# Patient Record
Sex: Female | Born: 1960 | Race: White | Hispanic: No | Smoking: Current every day smoker
Health system: Southern US, Community
[De-identification: ages and names within clinical notes are randomized; demographics above are authoritative.]

## PROBLEM LIST (undated history)

## (undated) DIAGNOSIS — F329 Major depressive disorder, single episode, unspecified: Secondary | ICD-10-CM

## (undated) DIAGNOSIS — K602 Anal fissure, unspecified: Secondary | ICD-10-CM

## (undated) DIAGNOSIS — F319 Bipolar disorder, unspecified: Secondary | ICD-10-CM

## (undated) DIAGNOSIS — F419 Anxiety disorder, unspecified: Secondary | ICD-10-CM

## (undated) DIAGNOSIS — Z87898 Personal history of other specified conditions: Secondary | ICD-10-CM

## (undated) DIAGNOSIS — M797 Fibromyalgia: Secondary | ICD-10-CM

## (undated) DIAGNOSIS — R569 Unspecified convulsions: Secondary | ICD-10-CM

## (undated) DIAGNOSIS — I619 Nontraumatic intracerebral hemorrhage, unspecified: Secondary | ICD-10-CM

## (undated) DIAGNOSIS — F32A Depression, unspecified: Secondary | ICD-10-CM

## (undated) DIAGNOSIS — I8290 Acute embolism and thrombosis of unspecified vein: Secondary | ICD-10-CM

## (undated) DIAGNOSIS — D689 Coagulation defect, unspecified: Secondary | ICD-10-CM

## (undated) DIAGNOSIS — Q600 Renal agenesis, unilateral: Secondary | ICD-10-CM

## (undated) HISTORY — DX: Unspecified convulsions: R56.9

## (undated) HISTORY — PX: TUBAL LIGATION: SHX77

## (undated) HISTORY — PX: TONSILLECTOMY: SUR1361

## (undated) HISTORY — DX: Renal agenesis, unilateral: Q60.0

## (undated) HISTORY — DX: Anxiety disorder, unspecified: F41.9

## (undated) HISTORY — DX: Coagulation defect, unspecified: D68.9

## (undated) HISTORY — DX: Personal history of other specified conditions: Z87.898

## (undated) HISTORY — DX: Anal fissure, unspecified: K60.2

## (undated) HISTORY — DX: Acute embolism and thrombosis of unspecified vein: I82.90

## (undated) HISTORY — DX: Fibromyalgia: M79.7

## (undated) HISTORY — DX: Nontraumatic intracerebral hemorrhage, unspecified: I61.9

## (undated) HISTORY — PX: APPENDECTOMY: SHX54

## (undated) HISTORY — PX: BREAST SURGERY: SHX581

## (undated) HISTORY — DX: Bipolar disorder, unspecified: F31.9

## (undated) HISTORY — PX: LAPAROSCOPY: SHX197

---

## 1998-02-07 HISTORY — PX: LEEP: SHX91

## 2005-04-05 ENCOUNTER — Emergency Department (HOSPITAL_COMMUNITY): Admission: EM | Admit: 2005-04-05 | Discharge: 2005-04-06 | Payer: Self-pay | Admitting: Emergency Medicine

## 2005-04-06 ENCOUNTER — Ambulatory Visit: Payer: Self-pay | Admitting: Psychiatry

## 2005-04-06 ENCOUNTER — Inpatient Hospital Stay (HOSPITAL_COMMUNITY): Admission: AD | Admit: 2005-04-06 | Discharge: 2005-04-09 | Payer: Self-pay | Admitting: Psychiatry

## 2005-05-31 ENCOUNTER — Ambulatory Visit: Payer: Self-pay | Admitting: Family Medicine

## 2005-07-12 ENCOUNTER — Ambulatory Visit: Payer: Self-pay | Admitting: Family Medicine

## 2005-07-12 ENCOUNTER — Other Ambulatory Visit: Admission: RE | Admit: 2005-07-12 | Discharge: 2005-07-12 | Payer: Self-pay | Admitting: Gynecology

## 2005-07-25 ENCOUNTER — Encounter: Admission: RE | Admit: 2005-07-25 | Discharge: 2005-07-25 | Payer: Self-pay | Admitting: Gynecology

## 2006-01-24 ENCOUNTER — Ambulatory Visit: Payer: Self-pay | Admitting: Family Medicine

## 2006-05-09 DIAGNOSIS — I8290 Acute embolism and thrombosis of unspecified vein: Secondary | ICD-10-CM

## 2006-05-09 HISTORY — DX: Acute embolism and thrombosis of unspecified vein: I82.90

## 2006-05-15 ENCOUNTER — Ambulatory Visit: Payer: Self-pay | Admitting: Family Medicine

## 2006-05-19 ENCOUNTER — Ambulatory Visit: Payer: Self-pay | Admitting: Family Medicine

## 2006-05-21 ENCOUNTER — Emergency Department (HOSPITAL_COMMUNITY): Admission: RE | Admit: 2006-05-21 | Discharge: 2006-05-21 | Payer: Self-pay | Admitting: Emergency Medicine

## 2006-05-24 ENCOUNTER — Emergency Department (HOSPITAL_COMMUNITY): Admission: EM | Admit: 2006-05-24 | Discharge: 2006-05-24 | Payer: Self-pay | Admitting: Emergency Medicine

## 2006-05-30 ENCOUNTER — Ambulatory Visit: Payer: Self-pay | Admitting: Family Medicine

## 2006-06-02 ENCOUNTER — Ambulatory Visit (HOSPITAL_COMMUNITY): Admission: RE | Admit: 2006-06-02 | Discharge: 2006-06-02 | Payer: Self-pay | Admitting: Neurology

## 2006-06-02 ENCOUNTER — Inpatient Hospital Stay (HOSPITAL_COMMUNITY): Admission: AD | Admit: 2006-06-02 | Discharge: 2006-06-09 | Payer: Self-pay | Admitting: Neurology

## 2006-06-12 ENCOUNTER — Ambulatory Visit: Payer: Self-pay | Admitting: Family Medicine

## 2006-06-19 ENCOUNTER — Ambulatory Visit: Payer: Self-pay | Admitting: Family Medicine

## 2006-06-29 ENCOUNTER — Ambulatory Visit: Payer: Self-pay | Admitting: Family Medicine

## 2006-07-12 ENCOUNTER — Ambulatory Visit: Payer: Self-pay | Admitting: Family Medicine

## 2006-08-14 ENCOUNTER — Ambulatory Visit: Payer: Self-pay | Admitting: Family Medicine

## 2006-08-28 ENCOUNTER — Ambulatory Visit: Payer: Self-pay | Admitting: Family Medicine

## 2006-08-28 DIAGNOSIS — I824Y9 Acute embolism and thrombosis of unspecified deep veins of unspecified proximal lower extremity: Secondary | ICD-10-CM

## 2006-08-28 LAB — CONVERTED CEMR LAB

## 2006-08-30 ENCOUNTER — Encounter: Payer: Self-pay | Admitting: Family Medicine

## 2006-09-07 ENCOUNTER — Telehealth: Payer: Self-pay | Admitting: Family Medicine

## 2006-09-13 ENCOUNTER — Ambulatory Visit: Payer: Self-pay | Admitting: Family Medicine

## 2006-09-29 ENCOUNTER — Ambulatory Visit: Payer: Self-pay | Admitting: Family Medicine

## 2006-09-29 LAB — CONVERTED CEMR LAB
INR: 3.3
Prothrombin Time: 21.8 s

## 2006-10-29 ENCOUNTER — Encounter: Admission: RE | Admit: 2006-10-29 | Discharge: 2006-10-29 | Payer: Self-pay | Admitting: Neurology

## 2006-10-31 ENCOUNTER — Ambulatory Visit: Payer: Self-pay | Admitting: Family Medicine

## 2006-10-31 LAB — CONVERTED CEMR LAB: INR: 1.8

## 2006-11-14 ENCOUNTER — Ambulatory Visit: Payer: Self-pay | Admitting: Family Medicine

## 2006-11-14 DIAGNOSIS — Z8672 Personal history of thrombophlebitis: Secondary | ICD-10-CM | POA: Insufficient documentation

## 2006-11-14 LAB — CONVERTED CEMR LAB: Prothrombin Time: 17.5 s

## 2006-11-28 ENCOUNTER — Ambulatory Visit: Payer: Self-pay | Admitting: Family Medicine

## 2006-11-28 LAB — CONVERTED CEMR LAB: INR: 3.2

## 2006-12-26 ENCOUNTER — Ambulatory Visit: Payer: Self-pay | Admitting: Family Medicine

## 2006-12-26 LAB — CONVERTED CEMR LAB: INR: 2.3

## 2007-01-08 ENCOUNTER — Telehealth: Payer: Self-pay | Admitting: Family Medicine

## 2007-01-25 ENCOUNTER — Ambulatory Visit: Payer: Self-pay | Admitting: Family Medicine

## 2007-02-05 ENCOUNTER — Ambulatory Visit: Payer: Self-pay | Admitting: Family Medicine

## 2007-02-05 LAB — CONVERTED CEMR LAB: INR: 1

## 2007-03-06 ENCOUNTER — Ambulatory Visit: Payer: Self-pay | Admitting: Family Medicine

## 2007-03-06 LAB — CONVERTED CEMR LAB: INR: 1.8

## 2007-06-29 ENCOUNTER — Other Ambulatory Visit: Admission: RE | Admit: 2007-06-29 | Discharge: 2007-06-29 | Payer: Self-pay | Admitting: Gynecology

## 2007-07-17 ENCOUNTER — Telehealth: Payer: Self-pay | Admitting: Family Medicine

## 2008-05-23 ENCOUNTER — Ambulatory Visit: Payer: Self-pay | Admitting: Family Medicine

## 2008-05-23 DIAGNOSIS — R1013 Epigastric pain: Secondary | ICD-10-CM | POA: Insufficient documentation

## 2008-05-23 DIAGNOSIS — F411 Generalized anxiety disorder: Secondary | ICD-10-CM | POA: Insufficient documentation

## 2008-08-25 ENCOUNTER — Ambulatory Visit: Payer: Self-pay | Admitting: Family Medicine

## 2008-08-25 DIAGNOSIS — S0100XA Unspecified open wound of scalp, initial encounter: Secondary | ICD-10-CM | POA: Insufficient documentation

## 2008-09-04 ENCOUNTER — Ambulatory Visit: Payer: Self-pay | Admitting: Family Medicine

## 2009-12-08 ENCOUNTER — Telehealth: Payer: Self-pay | Admitting: Family Medicine

## 2010-01-13 ENCOUNTER — Ambulatory Visit: Payer: Self-pay | Admitting: Family Medicine

## 2010-01-18 LAB — CONVERTED CEMR LAB
Bilirubin Urine: NEGATIVE
Nitrite: NEGATIVE

## 2010-01-20 ENCOUNTER — Ambulatory Visit: Payer: Self-pay | Admitting: Family Medicine

## 2010-01-20 ENCOUNTER — Other Ambulatory Visit
Admission: RE | Admit: 2010-01-20 | Discharge: 2010-01-20 | Payer: Self-pay | Source: Home / Self Care | Admitting: Family Medicine

## 2010-01-20 ENCOUNTER — Encounter: Payer: Self-pay | Admitting: Family Medicine

## 2010-01-20 LAB — CONVERTED CEMR LAB
Alkaline Phosphatase: 70 units/L (ref 39–117)
BUN: 5 mg/dL — ABNORMAL LOW (ref 6–23)
Basophils Absolute: 0 10*3/uL (ref 0.0–0.1)
Bilirubin, Direct: 0.1 mg/dL (ref 0.0–0.3)
CO2: 25 meq/L (ref 19–32)
Calcium: 9.4 mg/dL (ref 8.4–10.5)
Eosinophils Absolute: 0 10*3/uL (ref 0.0–0.7)
GFR calc non Af Amer: 73.75 mL/min (ref >60.00)
Glucose, Bld: 104 mg/dL — ABNORMAL HIGH (ref 70–99)
HCT: 40 % (ref 36.0–46.0)
HDL: 63.8 mg/dL (ref >39.00)
Lymphocytes Relative: 11.6 % — ABNORMAL LOW (ref 12.0–46.0)
Lymphs Abs: 0.9 10*3/uL (ref 0.7–4.0)
MCHC: 34.7 g/dL (ref 30.0–36.0)
MCV: 104.2 fL — ABNORMAL HIGH (ref 78.0–100.0)
Monocytes Absolute: 0.7 10*3/uL (ref 0.1–1.0)
Neutro Abs: 5.8 10*3/uL (ref 1.4–7.7)
RBC: 3.83 M/uL — ABNORMAL LOW (ref 3.87–5.11)
Sodium: 137 meq/L (ref 135–145)
Total Protein: 6.5 g/dL (ref 6.0–8.3)
Vitamin B-12: >1500 pg/mL — ABNORMAL HIGH (ref 211–911)

## 2010-01-22 ENCOUNTER — Telehealth: Payer: Self-pay | Admitting: Family Medicine

## 2010-01-25 ENCOUNTER — Encounter: Payer: Self-pay | Admitting: Family Medicine

## 2010-02-28 ENCOUNTER — Encounter: Payer: Self-pay | Admitting: Family Medicine

## 2010-03-09 NOTE — Progress Notes (Signed)
Summary: req vit b drawn  Phone Note Call from Patient Call back at [phone]   Summary of Call: pt is sch for cpx labs in dec 2011, pt would like to add vit b level to order. Initial call taken by: Heron Sabins,  December 08, 2009 2:57 PM  Follow-up for Phone Call        I agree. Have this added to her labs please  Follow-up by: Nelwyn Salisbury MD,  December 09, 2009 1:11 PM  Additional Follow-up for Phone Call Additional follow up Details #1::        done  Additional Follow-up by: Pura Spice, RN,  December 09, 2009 4:50 PM     Appended Document: req vit b drawn wants b12 lab  ordered . done

## 2010-03-10 ENCOUNTER — Encounter: Payer: Self-pay | Admitting: Family Medicine

## 2010-03-11 NOTE — Letter (Signed)
Summary: Results Follow-up Letter  [place] at [facility]  [address] [place], [place] [address]   Phone: [phone]  Fax: [phone]    01/25/2010  [address] [place], [place]  [address]  Dear Ms. Hoffmeister,   The following are the results of your recent test(s):  Test     Result     Pap Smear    Normal_______   Repeat 1 yr   ____________________  Sincerely, Dr Gershon Crane, Md  [place] at [facility]

## 2010-03-11 NOTE — Progress Notes (Signed)
Summary: change med  Phone Note Refill Request Call back at [phone] Message from:  Pharmacy on January 22, 2010 5:17 PM  Refills Requested: Medication #1:  TRIVEEN-ONE 27-1-250 MG CAPS once daily. [facility] said [facility]   Initial call taken by: Alfred Levins, CMA,  January 22, 2010 5:18 PM  Follow-up for Phone Call        switch to Cerefolin NAC to take once a day. call in a one year supply  Follow-up by: Nelwyn Salisbury MD,  January 22, 2010 5:41 PM    New/Updated Medications: CEREFOLIN NAC 6-2-600 MG TABS (METHYLFOL-METHYLCOB-ACETYLCYST) 1 by mouth once daily Prescriptions: CEREFOLIN NAC 6-2-600 MG TABS (METHYLFOL-METHYLCOB-ACETYLCYST) 1 by mouth once daily  #30 x 11   Entered by:   Alfred Levins, CMA   Authorized by:   Nelwyn Salisbury MD   Signed by:   Alfred Levins, CMA on 01/22/2010   Method used:   Telephoned to ...       [facility] [address] [phone]* (retail)       [address]       [place], [place]  [address]       Ph: [phone] or [phone]       Fax: [phone]   RxID:   [id number]

## 2010-03-11 NOTE — Assessment & Plan Note (Signed)
Summary: cpx/pap/njr   Vital Signs:  Patient profile:   50 year old female Height:      61 inches (154.94 cm) Weight:      108.50 pounds (49.32 kg) BMI:     20.57 O2 Sat:      98 % on Room air Temp:     98.2 degrees F (36.78 degrees C) oral Pulse rate:   111 / minute Resp:     20 per minute BP sitting:   124 / 88  (left arm) Cuff size:   regular  Vitals Entered By: Brenton Grills CMA Duncan Dull) (January 20, 2010 1:47 PM)  O2 Flow:  Room air CC: CPX and Pap/aj Is Patient Diabetic? No Comments Pt is no longer taking Cerefolin or Zegerid   History of Present Illness: 50 yr old female for a cpx. She feels well in general. her last Pap was around 5 years ago. Her last menses was one year ago.   Current Medications (verified): 1)  Seroquel 300 Mg  Tabs (Quetiapine Fumarate) .... Once Daily 2)  Cerefolin Nac 5.6-2-600 Mg  Tabs (Methylfol-Methylcob-Acetylcyst) .... Once Daily 3)  Zegerid 40-1100 Mg Caps (Omeprazole-Sodium Bicarbonate) .... Two Times A Day 4)  Cipro 500 Mg Tabs (Ciprofloxacin Hcl) .... One By Mouth Two Times A Day X 7 Days 5)  Gabapentin 300 Mg Caps (Gabapentin) .Marland Kitchen.. 1 Capsule By Mouth Three Times A Day 6)  Triveen-Duo Dha 29-1-200 & 400 Mg Misc (Prenat-Febis-Fepro-Fa-Ca-Omega) .Marland Kitchen.. 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 Pearlean Brownie. Took Coumadin for 6 months, off now  elevated homocysteine level  bipolar disorder, sees  Dr. Betti Cruz Anxiety fibromyalgia endometriosis single congenital kidney, saw Dr. Vonita Moss  Past Surgical History: Caesarean section Tubal ligation laparoscopy for endometriosis benign breast lump removal LEEP around 2000 for an abnormal Pap smear  Family History: Reviewed history from 05/23/2008 and no changes required. Family History of CAD Female 1st degree relative <50 Family History Hypertension Family History of  Stroke M 1st degree relative <50  Social History: Reviewed history from 05/23/2008 and no changes required. Divorced Current Smoker Alcohol use-no  Review of Systems  The patient denies anorexia, fever, weight loss, weight gain, vision loss, decreased hearing, hoarseness, chest pain, syncope, dyspnea on exertion, peripheral edema, prolonged cough, headaches, hemoptysis, abdominal pain, melena, hematochezia, severe indigestion/heartburn, hematuria, incontinence, genital sores, muscle weakness, suspicious skin lesions, transient blindness, difficulty walking, depression, unusual weight change, abnormal bleeding, enlarged lymph nodes, angioedema, breast masses, and testicular masses.    Physical Exam  General:  Well-developed,well-nourished,in no acute distress; alert,appropriate and cooperative throughout examination Head:  Normocephalic and atraumatic without obvious abnormalities. No apparent alopecia or balding. Eyes:  No corneal or conjunctival inflammation noted. EOMI. Perrla. Funduscopic exam benign, without hemorrhages, exudates or papilledema. Vision grossly normal. Ears:  External ear exam shows no significant lesions or deformities.  Otoscopic examination reveals clear canals, tympanic membranes are intact bilaterally without bulging, retraction, inflammation or discharge. Hearing is grossly normal bilaterally. Nose:  External nasal examination shows no deformity or inflammation. Nasal mucosa are pink and moist without lesions or exudates. Mouth:  Oral mucosa and oropharynx without lesions or exudates.  Teeth in good repair. Neck:  No deformities, masses, or tenderness noted. Chest Wall:  No deformities, masses, or tenderness noted. Lungs:  Normal respiratory effort, chest expands symmetrically. Lungs are clear to auscultation, no crackles or wheezes. Heart:  Normal rate and regular rhythm. S1 and S2 normal without gallop, murmur, click, rub or other extra sounds. Abdomen:  Bowel  sounds positive,abdomen soft and non-tender without masses, organomegaly or hernias noted. Genitalia:  Pelvic Exam:        External: normal female genitalia without lesions or masses        Vagina: normal without lesions or masses        Cervix: normal without lesions or masses        Adnexa: normal bimanual exam without masses or fullness        Uterus: normal by palpation        Pap smear: performed Msk:  No deformity or scoliosis noted of thoracic or lumbar spine.   Pulses:  R and L carotid,radial,femoral,dorsalis pedis and posterior tibial pulses are full and equal bilaterally Extremities:  No clubbing, cyanosis, edema, or deformity noted with normal full range of motion of all joints.   Neurologic:  No cranial nerve deficits noted. Station and gait are normal. Plantar reflexes are down-going bilaterally. DTRs are symmetrical throughout. Sensory, motor and coordinative functions appear intact. Skin:  Intact without suspicious lesions or rashes Cervical Nodes:  No lymphadenopathy noted Axillary Nodes:  No palpable lymphadenopathy Inguinal Nodes:  No significant adenopathy Psych:  Cognition and judgment appear intact. Alert and cooperative with normal attention span and concentration. No apparent delusions, illusions, hallucinations   Impression & Recommendations:  Problem # 1:  HEALTH MAINTENANCE EXAM (ICD-V70.0)  Orders: EKG w/ Interpretation (93000)  Complete Medication List: 1)  Seroquel 300 Mg Tabs (Quetiapine fumarate) .... Once daily 2)  Cipro 500 Mg Tabs (Ciprofloxacin hcl) .... One by mouth two times a day x 7 days 3)  Gabapentin 300 Mg Caps (Gabapentin) .Marland Kitchen.. 1 capsule by mouth three times a day 4)  Triveen-one 27-1-250 Mg Caps (Prenat w/o a-fe fum-fa-omega 3) .... Once daily  Patient Instructions: 1)  Please schedule a follow-up appointment in 6 months .  2)  Schedule your mammogram.  Prescriptions: TRIVEEN-ONE 27-1-250 MG CAPS (PRENAT W/O A-FE FUM-FA-OMEGA 3) once  daily  #30 x 11   Entered and Authorized by:   Nelwyn Salisbury MD   Signed by:   Nelwyn Salisbury MD on 01/20/2010   Method used:   [facility]        [facility] [address] [phone]* (retail)       [address]       [place], [place]  [address]       Ph: [phone] or [phone]       Fax: [phone]   RxID:   [id number]    Orders Added: 1)  Est. Patient 40-64 years [99396] 2)  EKG w/ Interpretation [93000]

## 2010-06-22 NOTE — Discharge Summary (Signed)
[name], [name]                ACCOUNT NO.:  [id number]   MEDICAL RECORD NO.:  [id number]          PATIENT TYPE:  INP   LOCATION:  5158                         FACILITY:  [facility]   PHYSICIAN:  Pramod P. Pearlean Brownie, MD    DATE OF BIRTH:  03/04/1960   DATE OF ADMISSION:  06/02/2006  DATE OF DISCHARGE:                               DISCHARGE SUMMARY   DISCHARGE DIAGNOSES:  1. 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  2. Hyperhomocysteinemia.  3. Borderline B12 level.  4. Anxiety, panic disorder.  5. Bipolar disease.  6. Fibromyalgia.   DISCHARGE MEDICATIONS:  1. Coumadin daily, dose to be determined.  2. Cerefolin MAC one a day.  3. Seroquel 300 mg at bedtime.  4. Xanax 0.25 mg b.i.d.  5. Ultram 50 mg q.6 hours p.r.n. headache.   ALLERGIES:  THE PATIENT HAS ALLERGIES TO COMPAZINE AND FLEXERIL.   STUDIES PERFORMED:  1. An MRI of the brain showed venous thrombosis.  2. An MRA of the brain showed thrombosis of the right transverse and      sigmoid sinus.  Also some superficial cortical vein thrombosis      bilaterally.  No hemorrhage or infarction is identified.  3. Lumbar puncture performed by radiology.  4. Cerebral angiogram showed cortical vein and left parasagittal      parietal lesion and dual venous sinus thrombosis with right      external jugular vein most consistent with local areas of      thrombosis.  No evidence of dural AV fistula is noted.  5. An EKG not performed.  6. Telemetry shows sinus rhythm.   LABORATORY STUDIES:  A CBC showed hemoglobin 12.5, hematocrit 37.3, MCV  101.6, RBW 17.6 and red blood cells 3.67.  White blood cells were  normal.  Coagulation studies on admission were normal.  On Jun 08, 2006,  INR was 1.8.  Factor V Leiden negative.  Lupus anticoagulant negative.  Protein C normal.  Protein S total elevated 159.  Protein S functional  elevated 183.  Protein C function normal.  Chemistry was ______  and was,  otherwise normal.  Glomerular filtration rate normal.  Chemistry normal.  Homocystine 107.4.  Hemoglobin A1c was 5.2, chloride 204, triglycerides  206, HDL 55, and LDL 108.  Urinalysis was normal.  CSF was clear in  color with rare lymphocytes, 21 protein, 64 glucose, negative BDRL.  No  organisms seen.  Grams clear and no growth or culture.  ASC negative.  Gram stain with white blood cells, no yeast.  Streptococcal antigen  negative.  Cardiolipin antibody pending.  Beta 2 microprotein antibody  pending.  Methylmalonic acid pending.  Prothrombin gene mutation G20210  negative.  ANA pending.  CH50 pending.  C3, C4 normal.   HISTORY OF PRESENT ILLNESS:  [name] is a 50 year old Caucasian  female with a past medical history of anxiety and bipolar disorder who  was seen in the office the day prior to admission by Dr. Thad Ranger with  complaints of a headache.  Dr. Thad Ranger noted papilledema  and an MRI and  lumbar puncture was arranged as an outpatient.  The MRI of the brain  showed venous thrombosis.  The patient also had seen Dr. Dione Booze within  the past month with complaints of headache and blurry vision.  He noted  bilateral papilledema.  The headache in the bitemporal frontal head  region was greater on the left than on the right.  She describes  pressure tightness in its intensity.  She was feeling off balance with  vertigo and few recent falls.  She was sent for admission after MRI  findings.  The patient was admitted to the hospital and anticoagulation  started.   HOSPITAL COURSE:  The patient was placed on IV heparin and Coumadin for  cortical venous thrombosis treatment.  Cerebral angiogram was performed,  which showed multiple filling defects and confirmed diagnosis.  It was  felt her thrombosis was secondary to elevated homocystine, which was the  highest we have ever seen at 107.4.  This is likely genetic, although it  could be related to vitamin B12.  Vitamin B12 is  below range of normal.  Methylmalonic acid is pending.  The patient may need vitamin B12  replacement depending on labs.  The primary care physician to followup  on this.  The patient was placed on Cerefolin in a.c. for _______  homocystine.  Partial hypercoagulable panel was completed and back and  negative.  The rest remains pending.  Especially that related to  vasculitis.  This will need to be followup as an outpatient also.  Plans  are for discharge on Jun 09, 2006, on Coumadin.  She will followup with  Dr. Clent Ridges Monday, May 5, at 3:15 for INR check and he has been asked to  manage Coumadin.  She will followup with Dr. Pearlean Brownie in 2 to 3 months.   CONDITION ON DISCHARGE:  The patient is alert and oriented x3.  No focal  neurologic deficits.  No cranial nerve deficits.  Chest clear to  auscultation.  Heart regular rate.   DISCHARGE PLAN:  1. Discharge home with family.  2. Coumadin x6 months for thrombosis treatment, being considered      discontinuing.  3. Followup with Dr. Clent Ridges within one month for medical management.  4. Followup with Dr. Clent Ridges Monday, May 5, at 3:15 for INR check and      followup with Dr. Delia Heady in 2 to 3 months.      [name], N.P.    ______________________________  Sunny Schlein. Pearlean Brownie, MD    SB/MEDQ  D:  06/08/2006  T:  06/08/2006  Job:  [id number]   cc:   Pramod P. Pearlean Brownie, MD  Tera Mater Clent Ridges, MD

## 2010-06-23 ENCOUNTER — Encounter: Payer: Self-pay | Admitting: Family Medicine

## 2010-06-23 ENCOUNTER — Other Ambulatory Visit: Payer: Self-pay | Admitting: Family Medicine

## 2010-06-23 ENCOUNTER — Ambulatory Visit (INDEPENDENT_AMBULATORY_CARE_PROVIDER_SITE_OTHER): Payer: Self-pay | Admitting: Family Medicine

## 2010-06-23 VITALS — BP 110/80 | Temp 98.7°F | Ht 62.0 in | Wt 106.0 lb

## 2010-06-23 DIAGNOSIS — N764 Abscess of vulva: Secondary | ICD-10-CM

## 2010-06-23 DIAGNOSIS — N63 Unspecified lump in unspecified breast: Secondary | ICD-10-CM

## 2010-06-23 DIAGNOSIS — N6323 Unspecified lump in the left breast, lower outer quadrant: Secondary | ICD-10-CM

## 2010-06-23 MED ORDER — DOXYCYCLINE HYCLATE 100 MG PO CAPS: 100.0000 mg | ORAL_CAPSULE | Freq: Two times a day (BID) | ORAL | Status: AC

## 2010-06-23 NOTE — Progress Notes (Signed)
  Subjective:    Patient ID: [name], female    DOB: [birth year], 50 y.o.   MRN: [id number]  HPI Here for 2 reasons. First she has had a painful lump in the left breast ever since she injured it this past February, about 3 months ago. That day as she was carrying a heavy board to her car. The board slipped and pinched her left breast. This was very painful but there was no bleeding. The area formed a tender lump that has persisted since then. No nipple DC or bleeding. Her last mammogram was a number of years ago. [facility], but they told her they would need a doctor's order since there was a problem. Also, yesterday she noticed an asymptomatic lump on the right labia.    Review of Systems  Constitutional: Negative.   Genitourinary: Positive for genital sores.       Objective:   Physical Exam  Constitutional: She appears well-developed and well-nourished.  Genitourinary:       The left breast has a firm mobile tender lump at the 4 o'clock position about 2 cm from the nipple. This has an area of intense erythema over it. No warmth. No nipple DC or bleeding. No axillary lymph nodes. The right breast is normal. The right labia majora has a tiny tender nodule           Assessment & Plan:  The breast lump appears to be an inflamed hematoma from the trauma she had 3 months ago, but we need to rule out neoplasm. We have written an order for a bilateral mammogram with an US of the left breast, and she will [facility] to [facility]. The labial lesion appears to be a small boil. Cover with Doxycycline.

## 2010-06-25 NOTE — Discharge Summary (Signed)
NAMEKATRICIA, [name] NO.:  [id number]   MEDICAL RECORD NO.:  [id number]          PATIENT TYPE:  IPS   LOCATION:  0306                          [facility]   PHYSICIAN:  Jeanice Lim, M.D. DATE OF BIRTH:  [birth year]   DATE OF ADMISSION:  04/06/2005  DATE OF DISCHARGE:  04/09/2005                                 DISCHARGE SUMMARY   IDENTIFYING DATA:  This is a 50 year old Caucasian female admitted after  reportedly threatening husband when he told her that he was going to kick  her out of the house.  She had been planning on seeking substance abuse  treatment and treatment for a mood disorder which is her primary diagnosis  and was on the way to get treatment when she had conflict with husband who  forced the treatment despite her willingness.  Therefore, accuracy of an  involuntary commitment when patient is willing to get treatment is somewhat  questionable.  The patient did agree to be voluntary status and therefore  was treated as a voluntary patient and participated fully in treatment.  She  had described that her and her husband were in the process of a separation  and divorce and that there may have been some actions taken by husband prior  to admission that related to his pursuit of having the divorce proceedings  work out in his favor.  The patient did admit to having mood swings and she  was not certain whether she was bipolar but described fair clear mood swings  and lacked the typical insight that bipolars lack regarding some of her mood  instability but was very willing to accept the diagnosis, learn about it and  was cooperative with treatment.  The patient required emotional support,  supportive therapy and developed healthier coping skills.   ALLERGIES:  COMPAZINE.   MEDICAL HISTORY:  No acute medical problems.   SUBSTANCE ABUSE HISTORY:  History of alcohol abuse, intermittent.  No other  substance use noted.  The triggers included the  separation and conflict with  husband and her mood disorder which she seems quite motivated to get proper  treatment but had difficult doing this on the outside and, while in the  process of going through separation, various conflicts with husband.  Husband requested a restraining order for threatening behavior before she  came in.  Her husband's communication while she was in the hospital appeared  to support that he was wanting her to get better.  She was prophylactically  placed on Librium and had been on Depakote for mood instability.  Medications were reconciled and resumed.  The patient described trigger  regarding mood instability and depression as separation and difficulty  dealing with the effect this may have on her daughter as well as ongoing  conflict with husband.  The patient has a very good support system which is  her mother and family and that she needs to get in contact with the divorce  attorney to protect her rights in the process and allow a intermediary to  deal with issues of dispute rather than  her be involved since this is not  healthy and triggers for mood instability.  The patient was initially  somewhat guarded and labile but rapidly improved as she was stabilized on  medications.  The patient has a long history of a familial tremor and was  placed on propranolol.  Family session was held with patient and patient's  sister and patient admitted to feeling depressed over marital issues.  However, family was supportive.  The patient also understood the importance  of her being compliant with the Depakote and receiving appropriate  psychiatric follow-up and admitted that she would sometimes feel sabotaged  by drinking.  However, would have better control of this if she was away  from her husband.  The patient had agreed to go to [facility] in part  because of threats from the husband that he would make her leave if she did  not.  However, the patient wanted to  seek outpatient substance abuse  treatment and, more importantly, psychiatric follow-up and medication  management and individual therapy regarding her mood instability and bipolar  disorder.  The patient was not highly motivated about AA or relapse  prevention plans and she felt the alcohol use was a secondary issue and took  the form of self-medicating related to her mood which may be the case.  However, the patient was encouraged to seek all resources available and  understand that any alcohol use in light of her bipolar disorder is going to  destabilize her.  The patient showed good insight regarding her inability to  express her feelings at the time and then later being affected by them.  She  was appropriate on the unit, participating fully in treatment, helpful to  other patients and sleeping and eating improved, tolerated medications and  had a robust response to treatment.   REVIEW OF SYSTEMS:  Within normal limits.   LABORATORY DATA:  Routine admission labs within normal limits.   MENTAL STATUS EXAM:  Alert and oriented.  Cooperative. Dressed casually.  Good hygiene.  Good eye contact.  No psychomotor abnormalities.  Mood  somewhat unstable, depressed, irritable, agitated at times but redirectible.  Highly motivated to get help and wanting control of her mood.  Thought  processes were goal directed with no evidence of psychotic symptoms.  No  acute suicidal or homicidal ideation.  Emphatically denied any thoughts of  wanting to hurt any family members including even her husband who she had  anger towards due to the manner in which the separation was occurring.  However, she seemed to be able to process this appropriately and planned on  using a lawyer to work through things rather than her get emotionally  involved, which might have a negative impact on her.  She was cognitively  intact.  Judgment and insight were somewhat impaired at the time of  admission.  ADMISSION  DIAGNOSES:  AXIS I:  Bipolar disorder, type 1, mixed state with  depressive features.  Rule out a history of alcohol abuse, intermittent.  AXIS II:  Deferred.  AXIS III:  No acute medical complaints.  AXIS IV:  Severe (stressors with separation, concern of the impact on  daughter and financial stress and limited legal support).  AXIS V:  GAF 40.   HOSPITAL COURSE:  The patient was admitted and optimized on medication and  underwent further evaluation.  Collateral information was obtained and  family session with sister was held to now go to end of hospital course.  The  patient continued to improve and was discharged in improved state with  euthymic mood.  Affect bright.  Markedly improved judgment and insight.  Highly motivated to remain abstinent from alcohol and seek follow-up  regarding her psychiatric condition, both through therapy and medication  management, gaining insight and learning about bipolar and the importance of  compliance and other healthy behavioral changes.   CONDITION ON DISCHARGE:  The patient was discharged in improved condition  with no risk issues, no suicidal or homicidal thoughts, no anger, very  appropriate on the unit.  The patient was given medication education.  Risk/benefit ratio and alternative treatments regarding medications were  reviewed and medication education reviewed at the time of discharge.   DISCHARGE MEDICATIONS:  1.  Depakote ER 250 mg, 3 at 9 p.m. for mood instability.  2.  Trazodone 50 mg, 1-1/2 at 9 p.m. for sleep restoration p.r.n.  3.  Inderal 10 mg t.i.d. for essential tremor.   FOLLOW UP:  The patient was discharged and treated as a voluntary status  patient due to her level of cooperation and motivation to get treatment.  She was accepting of recommendation to avoid alcohol, was recommended to  consider AA and to be compliant with psychotropic follow-up and medication  management and to report any side effects that she may have or  reoccurring  symptoms.  The patient is therefore discharged in improved condition.  Prognosis was good.   DISCHARGE DIAGNOSES:  AXIS I:  Bipolar disorder, type 1, mixed state with  depressive features.  Rule out a history of alcohol abuse, intermittent.  AXIS II:  Deferred.  AXIS III:  No acute medical complaints.  AXIS IV:  Severe (stressors with separation, concern of the impact on  daughter and financial stress and limited legal support).  AXIS V:  GAF on discharge 60.      Jeanice Lim, M.D.  Electronically Signed     JEM/MEDQ  D:  04/24/2005  T:  04/25/2005  Job:  [id number]

## 2010-06-25 NOTE — H&P (Signed)
[name], [name]                ACCOUNT NO.:  [id number]   MEDICAL RECORD NO.:  [id number]          PATIENT TYPE:  OBV   LOCATION:  3032                         FACILITY:  [facility]   PHYSICIAN:  Bevelyn Buckles. Champey, M.D.DATE OF BIRTH:  May 11, 1960   DATE OF ADMISSION:  06/02/2006  DATE OF DISCHARGE:                              HISTORY & PHYSICAL   REASON FOR ADMISSION:  Venous thrombosis/headache and visual  disturbance.   HISTORY OF PRESENT ILLNESS:  [name] is a 50 year old Caucasian  female with a past medical history of anxiety and bipolar who was seen  yesterday in the office by Dr. Thad Ranger and found to have headache and  papilledema.  I was arranged for MRI and lumbar puncture today.  MRI  showed venous thrombosis.  The patient has been complaining of a  headache for the past month and blurry vision and saw Dr. Thad Ranger , who  noticed the patient had bilateral papilledema.  The patient complaining  of headache in bitemporal frontal head region greater on the left than  the right.  She describes this pressure tightness in its intensity.  She  does not associate nausea, vomiting, photophobia, or phonophobia with  her headache.  The patient also has been feeling off balance and vertigo  with a few falls recently.  The patient underwent generalized weakness  and fatigue, bilateral hand and feet numbness, and increased anxiety  with panic attacks.  She denies any focal weakness, speech changes or  loss of consciousness.   PAST MEDICAL HISTORY:  1. Bipolar.  2. Anxiety.  3. Fibromyalgia.   MEDICATIONS:  Seroquel 300 mg q.h.s.   ALLERGIES:  THE PATIENT HAS DRUG ALLERGIES TO:  1. COMPAZINE.  2. FLEXERIL.   FAMILY HISTORY:  Positive for hypertension, heart disease, diabetes, and  stroke.   SOCIAL HISTORY:  The patient lives with mother.  She smokes one pack of  cigarettes per day.  Socially drinks alcohol.  Denies any drug use.   REVIEW OF SYSTEMS:  Positive as per  history of present illness, also for  chest discomfort, recent flu, and muscle pain.  Review of systems  negative as per history of present in greater than six other organ  systems.   PHYSICAL EXAMINATION:  VITAL SIGNS:  Blood pressure is 136/89, pulse is  80, temperature is 98, respirations 16.  HEENT:  Normocephalic, atraumatic.  Extraocular muscles are intact.  Pupils are equal, round, and reactive to light.  The patient has  bilateral pain.  NECK:  Supple.  HEART:  Regular.  LUNGS:  Clear.  ABDOMEN:  Soft.  EXTREMITIES:  Good pulses with no edema.  NEUROLOGIC:  The patient is awake, alert, and follows commands  appropriately.  Language is fluent.  Cranial Nerves II-XII are grossly  intact except for bilateral papilledema.  Motor examination:  The  patient has good strength in all four extremities, normal tone, no drift  is noted.  Sensory examination is within normal limits to light touch.  Reflexes are 1+ throughout.  Toes are neutral bilaterally.  Cerebellar  function is within normal.  Finger-to-nose  and gait were not assessed  secondary to safety.   Lumbar Puncture she had an opening pressure of 32 cm of water.   LABORATORY DATA:  Creatinine 0.86.  CSF showed WBCs of 2, glucose 64,  protein 21, RBCs 4.  Cryptococcal antigen is negative.  MRI of the brain  showed thrombosis of the right transverse and sigmoid sinuses and facial  cortical vein thrombosis as well.  There is no acute infarct or  hemorrhage noted.   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  Will admit the patient to the  stroke M.D. service, start intravenous heparin.  Get a cerebral  angiogram in the morning.  Check hypocoagulable labs and CBC and CMET.  Will continue her home medications.  I have discussed the case with Dr.  Thad Ranger who saw the patient in the office yesterday.  Will follow the  patient while she is in the  hospital.      Bevelyn Buckles. Nash Shearer, M.D.  Electronically Signed     DRC/MEDQ  D:  06/02/2006  T:  06/03/2006  Job:  [id number]

## 2010-06-29 ENCOUNTER — Ambulatory Visit
Admission: RE | Admit: 2010-06-29 | Discharge: 2010-06-29 | Disposition: A | Discharge status: Home / Self Care | Payer: Self-pay | Source: Ambulatory Visit | Attending: Family Medicine | Admitting: Family Medicine

## 2010-06-29 DIAGNOSIS — N6323 Unspecified lump in the left breast, lower outer quadrant: Secondary | ICD-10-CM

## 2010-07-02 ENCOUNTER — Telehealth: Payer: Self-pay | Admitting: *Deleted

## 2010-07-02 DIAGNOSIS — L989 Disorder of the skin and subcutaneous tissue, unspecified: Secondary | ICD-10-CM

## 2010-07-02 NOTE — Telephone Encounter (Signed)
Tell her that the referral has been done

## 2010-07-02 NOTE — Telephone Encounter (Signed)
Pt is requesting Dr. Clent Ridges look at her Mammogram and U/S result.  Needs referral to Derm per the MDs that read her mammogram.  She prefers Dr. Karlyn Agee.

## 2010-07-02 NOTE — Telephone Encounter (Signed)
Informed female contact of phone# given that referral has been ordered for Dr Karlyn Agee and we will contact Pt as soon as appt date & time is set.

## 2010-07-21 ENCOUNTER — Ambulatory Visit: Payer: Self-pay | Admitting: Family Medicine

## 2010-10-12 ENCOUNTER — Other Ambulatory Visit: Payer: Self-pay | Admitting: Family Medicine

## 2010-12-07 ENCOUNTER — Emergency Department (HOSPITAL_COMMUNITY)
Admission: EM | Admit: 2010-12-07 | Discharge: 2010-12-07 | Disposition: A | Discharge status: Home / Self Care | Payer: Self-pay | Attending: Emergency Medicine | Admitting: Emergency Medicine

## 2010-12-07 DIAGNOSIS — K6289 Other specified diseases of anus and rectum: Secondary | ICD-10-CM | POA: Insufficient documentation

## 2010-12-10 ENCOUNTER — Telehealth: Payer: Self-pay | Admitting: *Deleted

## 2010-12-10 ENCOUNTER — Encounter: Payer: Self-pay | Admitting: Family Medicine

## 2010-12-10 ENCOUNTER — Ambulatory Visit (INDEPENDENT_AMBULATORY_CARE_PROVIDER_SITE_OTHER): Payer: Self-pay | Admitting: Family Medicine

## 2010-12-10 VITALS — BP 112/70 | HR 84 | Temp 98.3°F | Wt 106.0 lb

## 2010-12-10 DIAGNOSIS — K602 Anal fissure, unspecified: Secondary | ICD-10-CM

## 2010-12-10 MED ORDER — PRAMOXINE-HC 1-1 % EX CREA: TOPICAL_CREAM | Freq: Four times a day (QID) | CUTANEOUS | Status: DC

## 2010-12-10 MED ORDER — L-METHYLFOLATE-B12-B6-B2 6-1-50-5 MG PO TABS: 1.0000 | ORAL_TABLET | Freq: Every day | ORAL | Status: DC

## 2010-12-10 MED ORDER — ALZ-NAC 6-2-600 MG PO TABS: 1.0000 | ORAL_TABLET | Freq: Every day | ORAL | Status: DC

## 2010-12-10 NOTE — Telephone Encounter (Signed)
Needs to know if ananpram rx should be dispensed for 1% or 2.5%, this was not indicated on the rx.

## 2010-12-10 NOTE — Progress Notes (Signed)
  Subjective:    Patient ID: [name], female    DOB: 1960-08-20, 50 y.o.   MRN: [id number]  HPI Here for one week of a painful anal fissure. Her BMs are not hard but they are painful, and there is a bit of blood with them. She saw the ER 2 days ago, and they told her to use Miralax and lidocaine solution. Also using Epsom salt soaks. This is not better and she still has a lot of pain.   Review of Systems  Gastrointestinal: Positive for rectal pain.       Objective:   Physical Exam  Constitutional: She appears well-developed and well-nourished.  Genitourinary:       There is a small anal fissure, no bleeding, several external hemorrhoids          Assessment & Plan:  Use the Miralax bid. Try Analpram HC cream.

## 2010-12-13 NOTE — Telephone Encounter (Signed)
Rx called in to pharmacy. 

## 2010-12-13 NOTE — Telephone Encounter (Signed)
This is for 2.5%

## 2011-02-14 ENCOUNTER — Telehealth: Payer: Self-pay | Admitting: Family Medicine

## 2011-02-14 NOTE — Telephone Encounter (Signed)
Pt was dx with anal fiscure and pt says that it hurts to sit down, walk or have a bm. Pt said that in between her buttock is red and swollen. Pt says that there are bumps around her rectum. Pt doesn't know what she needs to do. Pt says that she has used cream for diaper rash, and nothing has helped.

## 2011-02-14 NOTE — Telephone Encounter (Signed)
Appt made tomorrow with Dr. Clent Ridges.

## 2011-02-15 ENCOUNTER — Ambulatory Visit (INDEPENDENT_AMBULATORY_CARE_PROVIDER_SITE_OTHER): Payer: Self-pay | Admitting: Family Medicine

## 2011-02-15 ENCOUNTER — Encounter: Payer: Self-pay | Admitting: Family Medicine

## 2011-02-15 VITALS — BP 110/70 | HR 72 | Temp 98.2°F | Wt 104.0 lb

## 2011-02-15 DIAGNOSIS — K644 Residual hemorrhoidal skin tags: Secondary | ICD-10-CM

## 2011-02-15 DIAGNOSIS — K602 Anal fissure, unspecified: Secondary | ICD-10-CM

## 2011-02-15 NOTE — Progress Notes (Signed)
  Subjective:    Patient ID: [name], female    DOB: 1960/04/06, 51 y.o.   MRN: [id number]  HPI Here for continued pain in the anal area with BMs and occasional bleeding. Using Miralax which does keep the stools softer. When we saw her in November she had both a small fissure and some hemorrhoids. Using Analpram HC cream off and on.   Review of Systems  Constitutional: Negative.   Gastrointestinal: Positive for anal bleeding and rectal pain.       Objective:   Physical Exam  Constitutional: She appears well-developed and well-nourished.  Genitourinary:       Several external hemorrhoids and she still has a fissure at the verge, about 8mm long          Assessment & Plan:  Persistent anal fissure and hemorrhoids. Refer to Surgery

## 2011-03-02 ENCOUNTER — Encounter (INDEPENDENT_AMBULATORY_CARE_PROVIDER_SITE_OTHER): Payer: Self-pay | Admitting: General Surgery

## 2011-03-02 ENCOUNTER — Ambulatory Visit (INDEPENDENT_AMBULATORY_CARE_PROVIDER_SITE_OTHER): Payer: Self-pay | Admitting: General Surgery

## 2011-03-02 VITALS — BP 134/90 | HR 120 | Temp 97.4°F | Resp 18 | Ht 62.0 in | Wt 100.8 lb

## 2011-03-02 DIAGNOSIS — K602 Anal fissure, unspecified: Secondary | ICD-10-CM | POA: Insufficient documentation

## 2011-03-02 MED ORDER — AMBULATORY NON FORMULARY MEDICATION: 1.0000 "application " | Freq: Two times a day (BID) | Status: DC

## 2011-03-02 NOTE — Patient Instructions (Signed)
GETTING TO GOOD BOWEL HEALTH. Irregular bowel habits such as constipation and diarrhea can lead to many problems over time.  Having one soft bowel movement a day is the most important way to prevent further problems.  The anorectal canal is designed to handle stretching and feces to safely manage our ability to get rid of solid waste (feces, poop, stool) out of our body.  BUT, hard constipated stools can act like ripping concrete bricks and diarrhea can be a burning fire to this very sensitive area of our body, causing inflamed hemorrhoids, anal fissures, increasing risk is perirectal abscesses, abdominal pain/bloating, an making irritable bowel worse.     The goal: ONE SOFT BOWEL MOVEMENT A DAY!  To have soft, regular bowel movements:    Drink at least 8 tall glasses of water a day.     Take plenty of fiber.  Fiber is the undigested part of plant food that passes into the colon, acting s "natures broom" to encourage bowel motility and movement.  Fiber can absorb and hold large amounts of water. This results in a larger, bulkier stool, which is soft and easier to pass. Work gradually over several weeks up to 6 servings a day of fiber (25g a day even more if needed) in the form of: o Vegetables -- Root (potatoes, carrots, turnips), leafy green (lettuce, salad greens, celery, spinach), or cooked high residue (cabbage, broccoli, etc) o Fruit -- Fresh (unpeeled skin & pulp), Dried (prunes, apricots, cherries, etc ),  or stewed ( applesauce)  o Whole grain breads, pasta, etc (whole wheat)  o Bran cereals    Bulking Agents -- This type of water-retaining fiber generally is easily obtained each day by one of the following:  o Psyllium bran -- The psyllium plant is remarkable because its ground seeds can retain so much water. This product is available as Metamucil, Konsyl, Effersyllium, Per Diem Fiber, or the less expensive generic preparation in drug and health food stores. Although labeled a laxative, it really  is not a laxative.  o Methylcellulose -- This is another fiber derived from wood which also retains water. It is available as Citrucel. o Polyethylene Glycol - and "artificial" fiber commonly called Miralax or Glycolax.  It is helpful for people with gassy or bloated feelings with regular fiber o Flax Seed - a less gassy fiber than psyllium   No reading or other relaxing activity while on the toilet. If bowel movements take longer than 5 minutes, you are too constipated   AVOID CONSTIPATION.  High fiber and water intake usually takes care of this.  Sometimes a laxative is needed to stimulate more frequent bowel movements, but    Laxatives are not a good long-term solution as it can wear the colon out. o Osmotics (Milk of Magnesia, Fleets phosphosoda, Magnesium citrate, MiraLax, GoLytely) are safer than  o Stimulants (Senokot, Castor Oil, Dulcolax, Ex Lax)    o Do not take laxatives for more than 7days in a row.    IF SEVERELY CONSTIPATED, try a Bowel Retraining Program: o Do not use laxatives.  o Eat a diet high in roughage, such as bran cereals and leafy vegetables.  o Drink six (6) ounces of prune or apricot juice each morning.  o Eat two (2) large servings of stewed fruit each day.  o Take one (1) heaping tablespoon of a psyllium-based bulking agent twice a day. Use sugar-free sweetener when possible to avoid excessive calories.  o Eat a normal breakfast.  o   Set aside 15 minutes after breakfast to sit on the toilet, but do not strain to have a bowel movement.  o If you do not have a bowel movement by the third day, use an enema and repeat the above steps.    Controlling diarrhea o Switch to liquids and simpler foods for a few days to avoid stressing your intestines further. o Avoid dairy products (especially milk & ice cream) for a short time.  The intestines often can lose the ability to digest lactose when stressed. o Avoid foods that cause gassiness or bloating.  Typical foods include  beans and other legumes, cabbage, broccoli, and dairy foods.  Every person has some sensitivity to other foods, so listen to our body and avoid those foods that trigger problems for you. o Adding fiber (Citrucel, Metamucil, psyllium, Miralax) gradually can help thicken stools by absorbing excess fluid and retrain the intestines to act more normally.  Slowly increase the dose over a few weeks.  Too much fiber too soon can backfire and cause cramping & bloating. o Probiotics (such as active yogurt, Align, etc) may help repopulate the intestines and colon with normal bacteria and calm down a sensitive digestive tract.  Most studies show it to be of mild help, though, and such products can be costly. o Medicines:   Bismuth subsalicylate (ex. Kayopectate, Pepto Bismol) every 30 minutes for up to 6 doses can help control diarrhea.  Avoid if pregnant.   Loperamide (Immodium) can slow down diarrhea.  Start with two tablets (4mg  total) first and then try one tablet every 6 hours.  Avoid if you are having fevers or severe pain.  If you are not better or start feeling worse, stop all medicines and call your doctor for advice o Call your doctor if you are getting worse or not better.  Sometimes further testing (cultures, endoscopy, X-ray studies, bloodwork, etc) may be needed to help diagnose and treat the cause of the diarrhea.  Anal Fissure, Adult An anal fissure is a small tear or crack in the skin around the anus. Bleeding from a fissure usually stops on its own within a few minutes. However, bleeding will often reoccur with each bowel movement until the crack heals.  CAUSES   Passing large, hard stools.   Frequent diarrheal stools.   Constipation.   Inflammatory bowel disease (Crohn's disease or ulcerative colitis).   Infections.   Anal sex.  SYMPTOMS   Small amounts of blood seen on your stools, on toilet paper, or in the toilet after a bowel movement.   Rectal bleeding.   Painful bowel  movements.   Itching or irritation around the anus.  DIAGNOSIS Your caregiver will examine the anal area. An anal fissure can usually be seen with careful inspection. A rectal exam may be performed and a short tube (anoscope) may be used to examine the anal canal. TREATMENT   You may be instructed to take fiber supplements. These supplements can soften your stool to help make bowel movements easier.   Sitz baths may be recommended to help heal the tear. Do not use soap in the sitz baths.   A medicated cream or ointment may be prescribed to lessen discomfort.  HOME CARE INSTRUCTIONS   Maintain a diet high in fruits, whole grains, and vegetables. Avoid constipating foods like bananas and dairy products.   Take sitz baths as directed by your caregiver.   Drink enough fluids to keep your urine clear or pale yellow.   Only  take over-the-counter or prescription medicines for pain, discomfort, or fever as directed by your caregiver. Do not take aspirin as this may increase bleeding.   Do not use ointments containing numbing medications (anesthetics) or hydrocortisone. They could slow healing.  SEEK MEDICAL CARE IF:   Your fissure is not completely healed within 3 days.   You have further bleeding.   You have a fever.   You have diarrhea mixed with blood.   You have pain.   Your problem is getting worse rather than better.  MAKE SURE YOU:   Understand these instructions.   Will watch your condition.   Will get help right away if you are not doing well or get worse.  Document Released: 01/24/2005 Document Revised: 10/06/2010 Document Reviewed: 07/11/2010 [facility] Patient Information 2012 [place], [place].

## 2011-03-02 NOTE — Progress Notes (Signed)
Chief Complaint  Patient presents with  . Other    new pt- eval anal fissure and hems    HISTORY: 51 year old Caucasian female referred by Dr. Clent Ridges for evaluation of an anal fissure. The patient states that her symptoms started several months ago. However she states for the past week or 2 she is actually been doing much better. She states that initially she had some constipation and then developed pain with defecation. She states that it felt like she was passing a brick. She is still having irregular bowel movements. She states that if she drinks prune juice, she will have loose stools.  If she does not drink MiraLax,  she will be constipated. She denies bathroom reading. She does strain. She is no longer using the Anal-pram. She still has discomfort with BMs. She has occasional bright red blood that will drip in the commode. She only drinks about 2 bottles of water/day. She doesn't eat a high fiber foods.  She denies weight loss. She denies stool caliber changes.    Past Medical History  Diagnosis Date  . 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 History of elevated homocysteine   . Bipolar 1 disorder   . Anxiety   . Fibromyalgia   . Endometriosis   . Congenital single kidney   . Clotting disorder     hx of blood clot  . Cerebral hemorrhage   . Hemorrhoids   . Anal fissure      Past Surgical History  Procedure Date  . Cesarean section   . Tubal ligation   . Laparoscopy     endometriosis  . Breast surgery     benign breast lump removal  . Leep 2000  . Tonsillectomy   . Appendectomy     Current Outpatient Prescriptions  Medication Sig Dispense Refill  . Methylfol-Methylcob-Acetylcyst (ALZ-NAC) 6-2-600 MG TABS Take 1 tablet by mouth daily.  90 tablet  3  . QUEtiapine (SEROQUEL) 300 MG tablet Take 300 mg by mouth at bedtime.        . AMBULATORY NON FORMULARY MEDICATION Place 1 application rectally 2 (two) times daily. Medication Name:  Nifedipine 2% ointment  30 g  1     Allergies  Allergen Reactions  . Compazine     Shaking & couldn't swallow     Family History  Problem Relation Age of Onset  . Coronary artery disease Other   . Stroke Other   . Hypertension Other   . Heart disease Mother   . Cancer Sister     breast  . Heart disease Brother      History   Social History  . Marital Status: Legally Separated    Spouse Name: N/A    Number of Children: N/A  . Years of Education: N/A   Social History Main Topics  . Smoking status: Current Everyday Smoker -- 1.5 packs/day    Types: Cigarettes  . Smokeless tobacco: Never Used  . Alcohol Use: 0.5 oz/week    1 drink(s) per week  . Drug Use: No  . Sexually Active: None   Other Topics Concern  . None   Social History Narrative  . None     12 POINT REVIEW OF SYSTEMS - PERTINENT POSITIVES ONLY: See HPI  EXAM: Filed Vitals:   03/02/11 0857  BP: 134/90  Pulse: 120  Temp: 97.4 F (36.3 C)  Resp: 18    GEN:  NAD, Thin, appears old than  stated age HEENT: normocephalic; pupils equal and reactive; sclerae clear; dentition ok; mucous membranes moist NECK:  ; symmetric on extension; no palpable anterior or posterior cervical lymphadenopathy; no supraclavicular masses; no tenderness CHEST: clear to auscultation bilaterally without rales, rhonchi, or wheezes CARDIAC: regular rate and rhythm without significant murmur; peripheral pulses are full EXT:  non-tender without edema; no deformity NEURO: no gross focal deficits; no sign of tremor ABD:  Soft, NT, ND. Well healed RLQ and low transverse incision SKIN:  No jaundice, edema RECTAL: Anal fissure in posterior midline with small sentinel pile; grade 1 mixed column left lateral hemorrhoid. DRE deferred secondary to fissure   LABORATORY RESULTS: See Epic for most recent results   RADIOLOGY RESULTS: See Epic or I-Site for most recent results  DATA REVIEWED: Dr Claris Che notes  IMPRESSION:  Anal  fissure Grade 1 mixed column left lateral hemorrhoid  PLAN:  We discussed the etiology of anal fissures. The patient was given educational material as well as diagrams. We discussed nonoperative and operative management of anal fissures.  We discussed nonoperative management including correcting underlying bowel habits such as constipation, avoiding bathroom reading, avoiding straining with defecation. We also discussed the use of topical ointments such as nifedipine ointment. We also discussed the use of Botox injection.  With respect to surgical intervention, we discussed an anal sphincterotomy.   My recommendation was to start with non-operative management first.  The patient has elected to continue with non-operative management. She was given a Rx for nifedipine twice a day ointment. I recommended she continue warm water tub soaks, take supplemental fiber and gradually increase her fiber to 25 grams/day over several weeks, increase her water intake.   F/u 6 weeks.   Mary Sella. Andrey Campanile, MD, FACS General, Bariatric, & Minimally Invasive Surgery [facility]      Visit Diagnoses: 1. Anal fissure     Primary Care Physician: Nelwyn Salisbury, MD, MD

## 2011-04-22 ENCOUNTER — Encounter (INDEPENDENT_AMBULATORY_CARE_PROVIDER_SITE_OTHER): Payer: Self-pay | Admitting: General Surgery

## 2011-04-22 ENCOUNTER — Ambulatory Visit (INDEPENDENT_AMBULATORY_CARE_PROVIDER_SITE_OTHER): Payer: Self-pay | Admitting: General Surgery

## 2011-04-22 VITALS — BP 118/82 | HR 92 | Temp 98.9°F | Resp 16 | Ht 61.0 in | Wt 102.8 lb

## 2011-04-22 DIAGNOSIS — K602 Anal fissure, unspecified: Secondary | ICD-10-CM

## 2011-04-22 NOTE — Patient Instructions (Signed)
Call us when you are near the end of Nifedipine ointment prescription.  Anal Fissure, Adult An anal fissure is a small tear or crack in the skin around the anus. Bleeding from a fissure usually stops on its own within a few minutes. However, bleeding will often reoccur with each bowel movement until the crack heals.  CAUSES   Passing large, hard stools.   Frequent diarrheal stools.   Constipation.   Inflammatory bowel disease (Crohn's disease or ulcerative colitis).   Infections.   Anal sex.  SYMPTOMS   Small amounts of blood seen on your stools, on toilet paper, or in the toilet after a bowel movement.   Rectal bleeding.   Painful bowel movements.   Itching or irritation around the anus.  DIAGNOSIS Your caregiver will examine the anal area. An anal fissure can usually be seen with careful inspection. A rectal exam may be performed and a short tube (anoscope) may be used to examine the anal canal. TREATMENT   You may be instructed to take fiber supplements. These supplements can soften your stool to help make bowel movements easier.   Sitz baths may be recommended to help heal the tear. Do not use soap in the sitz baths.   A medicated cream or ointment may be prescribed to lessen discomfort.  HOME CARE INSTRUCTIONS   Maintain a diet high in fruits, whole grains, and vegetables. Avoid constipating foods like bananas and dairy products.   Take sitz baths as directed by your caregiver.   Drink enough fluids to keep your urine clear or pale yellow.   Only take over-the-counter or prescription medicines for pain, discomfort, or fever as directed by your caregiver. Do not take aspirin as this may increase bleeding.   Do not use ointments containing numbing medications (anesthetics) or hydrocortisone. They could slow healing.  SEEK MEDICAL CARE IF:   Your fissure is not completely healed within 3 days.   You have further bleeding.   You have a fever.   You have  diarrhea mixed with blood.   You have pain.   Your problem is getting worse rather than better.  MAKE SURE YOU:   Understand these instructions.   Will watch your condition.   Will get help right away if you are not doing well or get worse.  Document Released: 01/24/2005 Document Revised: 01/13/2011 Document Reviewed: 07/11/2010 [facility] Patient Information 2012 [place], [place].   Treatment options: We discussed nonoperative management including correcting underlying bowel habits such as constipation, avoiding bathroom reading, avoiding straining with defecation. We also discussed the use of topical ointments such as nifedipine ointment. We also discussed the use of Botox injection.  With respect to surgical intervention, we discussed an anal sphincterotomy. I described how the procedure is performed. We also discussed the aftercare. We discussed the risk and benefits of surgery including but not limited to bleeding, infection, blood clot formation, general anesthesia risk, urinary retention, and the risk of incontinence. We discussed a 20-25% chance of incontinence to flatus, a 10-20% chance of incontinence to liquid stool, and a 5-10% chance of incontinence to solid stool. I explained that the percentages I quoted are from the literature and not from my personal practice experience.

## 2011-04-22 NOTE — Progress Notes (Signed)
Chief Complaint  Patient presents with  . Anal Fissure    RECK    HISTORY: 51 year old Caucasian female referred by Dr. Clent Ridges for evaluation of an anal fissure comes back in for follow-up. The patient states that her symptoms are about the same - maybe a little bit better. She is using the nifedipine ointment twice a day. She is now drinking 6-8 glasses of water a day. She also states that she has increase the fiber in her diet. She is also taking supplemental fiber. She has been taking 3 tablespoons of Metamucil per day. She has been dividing it up throughout the day. She states that she will have good days and bad days with respect to anal pain. She reports having 1-2 bowel movements per day. She is no longer having any bleeding with bowel movements. However when she does have a bad day it fills like sharp needles.  Past Medical History  Diagnosis Date  . 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 History of elevated homocysteine   . Bipolar 1 disorder   . Anxiety   . Fibromyalgia   . Endometriosis   . Congenital single kidney   . Clotting disorder     hx of blood clot  . Cerebral hemorrhage   . Hemorrhoids   . Anal fissure      Past Surgical History  Procedure Date  . Cesarean section   . Tubal ligation   . Laparoscopy     endometriosis  . Breast surgery     benign breast lump removal  . Leep 2000  . Tonsillectomy   . Appendectomy     Current Outpatient Prescriptions  Medication Sig Dispense Refill  . AMBULATORY NON FORMULARY MEDICATION Place 1 application rectally 2 (two) times daily. Medication Name: Nifedipine 2% ointment  30 g  1  . Methylfol-Methylcob-Acetylcyst (ALZ-NAC) 6-2-600 MG TABS Take 1 tablet by mouth daily.  90 tablet  3  . NON FORMULARY nifedepine cream 2%      . QUEtiapine (SEROQUEL) 300 MG tablet Take 300 mg by mouth at bedtime.           Allergies  Allergen Reactions  . Compazine     Shaking & couldn't swallow      Family History  Problem Relation Age of Onset  . Coronary artery disease Other   . Stroke Other   . Hypertension Other   . Heart disease Mother   . Cancer Sister     breast  . Heart disease Brother      History   Social History  . Marital Status: Legally Separated    Spouse Name: N/A    Number of Children: N/A  . Years of Education: N/A   Social History Main Topics  . Smoking status: Current Everyday Smoker -- 1.5 packs/day    Types: Cigarettes  . Smokeless tobacco: Never Used  . Alcohol Use: 0.5 oz/week    1 drink(s) per week  . Drug Use: No  . Sexually Active: None   Other Topics Concern  . None   Social History Narrative  . None     12 POINT REVIEW OF SYSTEMS - PERTINENT POSITIVES ONLY: See HPI  EXAM: Filed Vitals:   04/22/11 1144  BP: 118/82  Pulse: 92  Temp: 98.9 F (37.2 C)  Resp: 16    GEN:  NAD, Thin, appears old than stated age,  HEENT: normocephalic; pupils equal and reactive; sclerae clear; dentition  ok; mucous membranes moist NECK:  ; symmetric on extension; no palpable anterior or posterior cervical lymphadenopathy; no supraclavicular masses; no tenderness CHEST: clear to auscultation bilaterally without rales, rhonchi, or wheezes CARDIAC: regular rate and rhythm without significant murmur; peripheral pulses are full EXT:  non-tender without edema; no deformity NEURO: no gross focal deficits; no sign of tremor ABD:  Soft, NT, ND. Well healed RLQ and low transverse incision SKIN:  No jaundice, edema RECTAL: Anal fissure in posterior midline with small sentinel pile; grade 1 mixed column left lateral hemorrhoid. DRE deferred secondary to fissure   LABORATORY RESULTS: See Epic for most recent results   RADIOLOGY RESULTS: See Epic or I-Site for most recent results  DATA REVIEWED: Dr Claris Che notes  IMPRESSION:  Anal fissure Grade 1 mixed column left lateral hemorrhoid  PLAN: We re-discussed the etiology of anal fissures.  We  re-discussed nonoperative and operative management of anal fissures.  We re-discussed ongoing nonoperative management. We also discussed the use of topical ointments such as nifedipine ointment. We also discussed the use of Botox injection.  With respect to surgical intervention, we discussed an anal sphincterotomy and it's success rate.  I described how the procedure is performed. We also discussed the aftercare. We discussed the risk and benefits of surgery including but not limited to bleeding, infection, blood clot formation, general anesthesia risk, urinary retention, and the risk of incontinence. We discussed a 20-25% chance of incontinence to flatus, a 10-20% chance of incontinence to liquid stool, and a 5-10% chance of incontinence to solid stool. I explained that the percentages I quoted are from the literature and not from my personal practice experience.  The patient has elected to continue with non-operative management for now. I suggested she try using the nifedipine ointment three times a day.   F/u 8 weeks.   Mary Sella. Andrey Campanile, MD, FACS General, Bariatric, & Minimally Invasive Surgery [facility]      Visit Diagnoses: 1. Anal fissure     Primary Care Physician: Nelwyn Salisbury, MD, MD

## 2011-06-29 ENCOUNTER — Encounter (INDEPENDENT_AMBULATORY_CARE_PROVIDER_SITE_OTHER): Payer: Self-pay | Admitting: General Surgery

## 2012-07-19 ENCOUNTER — Telehealth: Payer: Self-pay | Admitting: Neurology

## 2012-07-19 NOTE — Telephone Encounter (Signed)
Patient's friend is calling to tell us the patient is having symptoms of a poss CVA or TIA.  Her R hand is curling, R leg is numb and dragging her leg while walking.  He would like to speak with a triage nurse if possible. His call back number is:  [phone]

## 2012-07-19 NOTE — Telephone Encounter (Signed)
I spoke to patient who said she was fine, I advised her to go to ER if she had any stroke symptoms.  I also spoke to friend and he is aware of stroke symptoms and told him to take her to ER if she displays any of those symptoms.  He was in agreement.

## 2012-07-31 ENCOUNTER — Encounter (HOSPITAL_COMMUNITY): Payer: Self-pay | Admitting: *Deleted

## 2012-07-31 ENCOUNTER — Emergency Department (HOSPITAL_COMMUNITY)
Admission: EM | Admit: 2012-07-31 | Discharge: 2012-07-31 | Disposition: A | Discharge status: Home / Self Care | Payer: Self-pay | Attending: Emergency Medicine | Admitting: Emergency Medicine

## 2012-07-31 DIAGNOSIS — Z8719 Personal history of other diseases of the digestive system: Secondary | ICD-10-CM | POA: Insufficient documentation

## 2012-07-31 DIAGNOSIS — N390 Urinary tract infection, site not specified: Secondary | ICD-10-CM | POA: Insufficient documentation

## 2012-07-31 DIAGNOSIS — IMO0001 Reserved for inherently not codable concepts without codable children: Secondary | ICD-10-CM | POA: Insufficient documentation

## 2012-07-31 DIAGNOSIS — R109 Unspecified abdominal pain: Secondary | ICD-10-CM | POA: Insufficient documentation

## 2012-07-31 DIAGNOSIS — R110 Nausea: Secondary | ICD-10-CM | POA: Insufficient documentation

## 2012-07-31 DIAGNOSIS — N12 Tubulo-interstitial nephritis, not specified as acute or chronic: Secondary | ICD-10-CM | POA: Insufficient documentation

## 2012-07-31 DIAGNOSIS — I749 Embolism and thrombosis of unspecified artery: Secondary | ICD-10-CM | POA: Insufficient documentation

## 2012-07-31 DIAGNOSIS — Z8659 Personal history of other mental and behavioral disorders: Secondary | ICD-10-CM | POA: Insufficient documentation

## 2012-07-31 DIAGNOSIS — Z862 Personal history of diseases of the blood and blood-forming organs and certain disorders involving the immune mechanism: Secondary | ICD-10-CM | POA: Insufficient documentation

## 2012-07-31 DIAGNOSIS — Z8742 Personal history of other diseases of the female genital tract: Secondary | ICD-10-CM | POA: Insufficient documentation

## 2012-07-31 DIAGNOSIS — Q602 Renal agenesis, unspecified: Secondary | ICD-10-CM | POA: Insufficient documentation

## 2012-07-31 DIAGNOSIS — F17200 Nicotine dependence, unspecified, uncomplicated: Secondary | ICD-10-CM | POA: Insufficient documentation

## 2012-07-31 DIAGNOSIS — F411 Generalized anxiety disorder: Secondary | ICD-10-CM | POA: Insufficient documentation

## 2012-07-31 DIAGNOSIS — Z7982 Long term (current) use of aspirin: Secondary | ICD-10-CM | POA: Insufficient documentation

## 2012-07-31 LAB — URINE MICROSCOPIC-ADD ON

## 2012-07-31 LAB — URINALYSIS, ROUTINE W REFLEX MICROSCOPIC
Ketones, ur: 15 mg/dL — AB
Nitrite: POSITIVE — AB
Specific Gravity, Urine: 1.02 (ref 1.005–1.030)
pH: 5.5 (ref 5.0–8.0)

## 2012-07-31 LAB — POCT I-STAT, CHEM 8
Calcium, Ion: 1.1 mmol/L — ABNORMAL LOW (ref 1.12–1.23)
Chloride: 106 mEq/L (ref 96–112)
HCT: 43 % (ref 36.0–46.0)
Potassium: 4.3 mEq/L (ref 3.5–5.1)
Sodium: 137 mEq/L (ref 135–145)

## 2012-07-31 MED ORDER — KETOROLAC TROMETHAMINE 30 MG/ML IJ SOLN
30.0000 mg | Freq: Once | INTRAMUSCULAR | Status: AC
  Administered 2012-07-31: 30 mg via INTRAVENOUS
  Filled 2012-07-31: qty 1

## 2012-07-31 MED ORDER — LORAZEPAM 2 MG/ML IJ SOLN
1.0000 mg | Freq: Once | INTRAMUSCULAR | Status: AC
  Administered 2012-07-31: 2 mg via INTRAMUSCULAR
  Filled 2012-07-31: qty 1

## 2012-07-31 MED ORDER — CIPROFLOXACIN HCL 500 MG PO TABS: 500.0000 mg | ORAL_TABLET | Freq: Two times a day (BID) | ORAL | Status: DC

## 2012-07-31 MED ORDER — PROMETHAZINE HCL 25 MG PO TABS: 25.0000 mg | ORAL_TABLET | Freq: Four times a day (QID) | ORAL | Status: DC | PRN

## 2012-07-31 MED ORDER — DEXTROSE 5 % IV SOLN
1.0000 g | Freq: Once | INTRAVENOUS | Status: AC
  Administered 2012-07-31: 1 g via INTRAVENOUS
  Filled 2012-07-31: qty 10

## 2012-07-31 MED ORDER — LORAZEPAM 1 MG PO TABS: 0.5000 mg | ORAL_TABLET | Freq: Three times a day (TID) | ORAL | Status: DC | PRN

## 2012-07-31 MED ORDER — METHOCARBAMOL 500 MG PO TABS
500.0000 mg | ORAL_TABLET | Freq: Once | ORAL | Status: AC
  Administered 2012-07-31: 500 mg via ORAL
  Filled 2012-07-31: qty 1

## 2012-07-31 MED ORDER — TRAMADOL HCL 50 MG PO TABS: 50.0000 mg | ORAL_TABLET | Freq: Four times a day (QID) | ORAL | Status: DC | PRN

## 2012-07-31 MED ORDER — METHOCARBAMOL 100 MG/ML IJ SOLN
500.0000 mg | Freq: Once | INTRAMUSCULAR | Status: DC
  Filled 2012-07-31: qty 5

## 2012-07-31 NOTE — ED Notes (Signed)
To ED for eval of severe lower back pain. No injury. No difficulty with urination. States feels like she cramping all over. Nausea. Pt shaking all over. States she woke and took shower, but didn't feel right at that time. Anxious. No CP. No SOB. States she has a HA that started 'a while' ago. Pt lives alone.

## 2012-07-31 NOTE — ED Provider Notes (Signed)
History    CSN: [id number] Arrival date & time 07/31/12  1032  First MD Initiated Contact with Patient 07/31/12 1201     Chief Complaint  Patient presents with  . Back Pain   (Consider location/radiation/quality/duration/timing/severity/associated sxs/prior Treatment) HPI Comments: Patient presenting with lower back pain.  Pain has been present for the past week and is constant.  Pain located across her lower back and does not radiate.  She has not taken anything for the pain prior to arrival.  Pain worse with movement.  She denies any injury or trauma to the back.  Denies any heavy lifting.  Denies numbness or tingling.  Denies fever or chills.  Denies nausea or vomiting.  Denies dysuria, but has had increased urinary frequency and urgency.  Denies hematuria.  Denies numbness or tingling.  Denies bowel or bladder incontinence.  She denies prior history of Pyelonephritis or Kidney Stones.  She also reports that she has been very anxious.  She had a job interview today and this morning began shaking and felt very anxious.  She reports that she has been having increased anxiety over the past 2 weeks and has intermittent shaking over the time.  She does have a history of Anxiety, Fibromyalgia, and Bipolar.    The history is provided by the patient.   Past Medical History  Diagnosis Date  . 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 History of elevated homocysteine   . Bipolar 1 disorder   . Anxiety   . Fibromyalgia   . Endometriosis   . Congenital single kidney   . Clotting disorder     hx of blood clot  . Cerebral hemorrhage   . Hemorrhoids   . Anal fissure    Past Surgical History  Procedure Laterality Date  . Cesarean section    . Tubal ligation    . Laparoscopy      endometriosis  . Breast surgery      benign breast lump removal  . Leep  2000  . Tonsillectomy    . Appendectomy     Family History  Problem Relation Age of Onset  . Coronary  artery disease Other   . Stroke Other   . Hypertension Other   . Heart disease Mother   . Cancer Sister     breast  . Heart disease Brother    History  Substance Use Topics  . Smoking status: Current Every Day Smoker -- 1.50 packs/day    Types: Cigarettes  . Smokeless tobacco: Never Used  . Alcohol Use: 4.7 oz/week    1 Drinks containing 0.5 oz of alcohol, 7 Cans of beer per week   OB History   Grav Para Term Preterm Abortions TAB SAB Ect Mult Living                 Review of Systems  Constitutional: Negative for fever and chills.  Gastrointestinal: Positive for nausea.  Genitourinary: Positive for flank pain.    Allergies  Acetaminophen and Compazine  Home Medications   Current Outpatient Rx  Name  Route  Sig  Dispense  Refill  . aspirin 325 MG tablet   Oral   Take 650 mg by mouth daily.          BP 149/94  Pulse 81  Temp(Src) 98.8 F (37.1 C) (Oral)  Resp 16  Ht 5\' 2"  (1.575 m)  Wt 99 lb (44.906 kg)  BMI 18.1 kg/m2  SpO2 100% Physical Exam  Nursing note and vitals reviewed. Constitutional: She appears well-developed and well-nourished.  HENT:  Head: Normocephalic and atraumatic.  Neck: Normal range of motion. Neck supple.  Cardiovascular: Normal rate, regular rhythm and normal heart sounds.   Pulmonary/Chest: Effort normal and breath sounds normal. No respiratory distress.  Abdominal: Soft. Normal appearance and bowel sounds are normal. She exhibits no distension. There is no tenderness. There is CVA tenderness. There is no rigidity and no guarding.  Bilateral CVA tenderness Tenderness to palpation across the entire lower back  Musculoskeletal: Normal range of motion.  Neurological: She is alert. She has normal strength. No sensory deficit. Gait normal.  Reflex Scores:      Patellar reflexes are 2+ on the right side and 2+ on the left side.      Achilles reflexes are 2+ on the right side and 2+ on the left side. Skin: Skin is warm and dry.   Psychiatric: Her mood appears anxious.    ED Course  Procedures (including critical care time) Labs Reviewed  URINALYSIS, ROUTINE W REFLEX MICROSCOPIC - Abnormal; Notable for the following:    Color, Urine AMBER (*)    APPearance TURBID (*)    Hgb urine dipstick MODERATE (*)    Ketones, ur 15 (*)    Protein, ur 100 (*)    Nitrite POSITIVE (*)    Leukocytes, UA LARGE (*)    All other components within normal limits  URINE MICROSCOPIC-ADD ON - Abnormal; Notable for the following:    Squamous Epithelial / LPF FEW (*)    Bacteria, UA MANY (*)    All other components within normal limits  POCT I-STAT, CHEM 8 - Abnormal; Notable for the following:    BUN <3 (*)    Glucose, Bld 101 (*)    Calcium, Ion 1.10 (*)    All other components within normal limits  URINE CULTURE   No results found. No diagnosis found.  MDM  Patient with back pain.  No neurological deficits and normal neuro exam.  Patient can walk but states is painful.  No loss of bowel or bladder control.  No concern for cauda equina.  No fever, night sweats, weight loss, h/o cancer, IVDU.  RICE protocol and pain medicine indicated and discussed with patient.   UA also showing UTI.  Therefore, patient could have Pyelonephritis as well.  Patient is afebrile.  No nausea or vomiting.  Patient given IV dose of Ceftriaxone and discharged home on Cipro.  Return precautions given.  Pascal Lux [place], PA-C 08/02/12 0900

## 2012-08-02 LAB — URINE CULTURE: Colony Count: >=100000

## 2012-08-02 NOTE — ED Provider Notes (Signed)
Medical screening examination/treatment/procedure(s) were performed by non-physician practitioner and as supervising physician I was immediately available for consultation/collaboration.  Lyanne Co, MD 08/02/12 [phone]

## 2012-08-03 NOTE — ED Notes (Signed)
Post ED Visit - Positive Culture Follow-up  Culture report reviewed by antimicrobial stewardship pharmacist: []  Wes Dulaney, Pharm.D., BCPS [x]  Celedonio Miyamoto, Pharm.D., BCPS []  Georgina Pillion, Pharm.D., BCPS []  [place], [address].D., BCPS, AAHIVP []  Estella Husk, Pharm.D., BCPS, AAHIVP  Positive urine culture Treated with Cipro, organism sensitive to the same and no further patient follow-up is required at this time.  Larena Sox 08/03/2012, 2:50 PM

## 2013-03-06 ENCOUNTER — Encounter (HOSPITAL_COMMUNITY): Payer: Self-pay | Admitting: Emergency Medicine

## 2013-03-06 ENCOUNTER — Emergency Department (HOSPITAL_COMMUNITY): Payer: Self-pay

## 2013-03-06 ENCOUNTER — Emergency Department (HOSPITAL_COMMUNITY)
Admission: EM | Admit: 2013-03-06 | Discharge: 2013-03-07 | Disposition: A | Discharge status: Home / Self Care | Payer: Self-pay | Attending: Emergency Medicine | Admitting: Emergency Medicine

## 2013-03-06 DIAGNOSIS — K602 Anal fissure, unspecified: Secondary | ICD-10-CM

## 2013-03-06 DIAGNOSIS — Z8672 Personal history of thrombophlebitis: Secondary | ICD-10-CM

## 2013-03-06 DIAGNOSIS — R1013 Epigastric pain: Secondary | ICD-10-CM

## 2013-03-06 DIAGNOSIS — F10929 Alcohol use, unspecified with intoxication, unspecified: Secondary | ICD-10-CM

## 2013-03-06 DIAGNOSIS — T43502A Poisoning by unspecified antipsychotics and neuroleptics, intentional self-harm, initial encounter: Secondary | ICD-10-CM | POA: Insufficient documentation

## 2013-03-06 DIAGNOSIS — T438X2A Poisoning by other psychotropic drugs, intentional self-harm, initial encounter: Secondary | ICD-10-CM

## 2013-03-06 DIAGNOSIS — T424X4A Poisoning by benzodiazepines, undetermined, initial encounter: Secondary | ICD-10-CM | POA: Insufficient documentation

## 2013-03-06 DIAGNOSIS — F1010 Alcohol abuse, uncomplicated: Secondary | ICD-10-CM | POA: Insufficient documentation

## 2013-03-06 DIAGNOSIS — Z3202 Encounter for pregnancy test, result negative: Secondary | ICD-10-CM | POA: Insufficient documentation

## 2013-03-06 DIAGNOSIS — F411 Generalized anxiety disorder: Secondary | ICD-10-CM

## 2013-03-06 DIAGNOSIS — T424X1A Poisoning by benzodiazepines, accidental (unintentional), initial encounter: Secondary | ICD-10-CM

## 2013-03-06 DIAGNOSIS — N390 Urinary tract infection, site not specified: Secondary | ICD-10-CM | POA: Insufficient documentation

## 2013-03-06 DIAGNOSIS — I824Y9 Acute embolism and thrombosis of unspecified deep veins of unspecified proximal lower extremity: Secondary | ICD-10-CM

## 2013-03-06 DIAGNOSIS — S0100XA Unspecified open wound of scalp, initial encounter: Secondary | ICD-10-CM

## 2013-03-06 HISTORY — DX: Depression, unspecified: F32.A

## 2013-03-06 HISTORY — DX: Major depressive disorder, single episode, unspecified: F32.9

## 2013-03-06 LAB — COMPREHENSIVE METABOLIC PANEL
ALBUMIN: 3.4 g/dL — AB (ref 3.5–5.2)
ALK PHOS: 85 U/L (ref 39–117)
ALT: 8 U/L (ref 0–35)
AST: 21 U/L (ref 0–37)
BUN: 4 mg/dL — ABNORMAL LOW (ref 6–23)
CO2: 21 mEq/L (ref 19–32)
Calcium: 8.8 mg/dL (ref 8.4–10.5)
Chloride: 97 mEq/L (ref 96–112)
Creatinine, Ser: 0.76 mg/dL (ref 0.50–1.10)
GFR calc Af Amer: >90 mL/min (ref >90)
GFR calc non Af Amer: >90 mL/min (ref >90)
Glucose, Bld: 73 mg/dL (ref 70–99)
POTASSIUM: 4.3 meq/L (ref 3.7–5.3)
SODIUM: 134 meq/L — AB (ref 137–147)
TOTAL PROTEIN: 6.6 g/dL (ref 6.0–8.3)
Total Bilirubin: 0.4 mg/dL (ref 0.3–1.2)

## 2013-03-06 LAB — RAPID URINE DRUG SCREEN, HOSP PERFORMED
AMPHETAMINES: NOT DETECTED
Barbiturates: NOT DETECTED
Benzodiazepines: NOT DETECTED
Cocaine: NOT DETECTED
OPIATES: NOT DETECTED
Tetrahydrocannabinol: NOT DETECTED

## 2013-03-06 LAB — CBC WITH DIFFERENTIAL/PLATELET
BASOS ABS: 0 10*3/uL (ref 0.0–0.1)
BASOS PCT: 1 % (ref 0–1)
EOS ABS: 0.1 10*3/uL (ref 0.0–0.7)
Eosinophils Relative: 1 % (ref 0–5)
HCT: 43.2 % (ref 36.0–46.0)
Hemoglobin: 15 g/dL (ref 12.0–15.0)
Lymphocytes Relative: 26 % (ref 12–46)
Lymphs Abs: 1.4 10*3/uL (ref 0.7–4.0)
MCH: 34.6 pg — AB (ref 26.0–34.0)
MCHC: 34.7 g/dL (ref 30.0–36.0)
MCV: 99.5 fL (ref 78.0–100.0)
Monocytes Absolute: 0.4 10*3/uL (ref 0.1–1.0)
Monocytes Relative: 7 % (ref 3–12)
NEUTROS PCT: 66 % (ref 43–77)
Neutro Abs: 3.7 10*3/uL (ref 1.7–7.7)
PLATELETS: 133 10*3/uL — AB (ref 150–400)
RBC: 4.34 MIL/uL (ref 3.87–5.11)
RDW: 13.9 % (ref 11.5–15.5)
WBC: 5.6 10*3/uL (ref 4.0–10.5)

## 2013-03-06 LAB — URINALYSIS, ROUTINE W REFLEX MICROSCOPIC
Bilirubin Urine: NEGATIVE
GLUCOSE, UA: NEGATIVE mg/dL
Hgb urine dipstick: NEGATIVE
Ketones, ur: NEGATIVE mg/dL
Nitrite: POSITIVE — AB
PH: 6 (ref 5.0–8.0)
PROTEIN: NEGATIVE mg/dL
Specific Gravity, Urine: 1.005 (ref 1.005–1.030)
Urobilinogen, UA: 0.2 mg/dL (ref 0.0–1.0)

## 2013-03-06 LAB — URINE MICROSCOPIC-ADD ON

## 2013-03-06 LAB — ACETAMINOPHEN LEVEL

## 2013-03-06 LAB — SALICYLATE LEVEL: Salicylate Lvl: <2 mg/dL — ABNORMAL LOW (ref 2.8–20.0)

## 2013-03-06 LAB — ETHANOL: Alcohol, Ethyl (B): 295 mg/dL — ABNORMAL HIGH (ref 0–11)

## 2013-03-06 LAB — POCT PREGNANCY, URINE: Preg Test, Ur: NEGATIVE

## 2013-03-06 MED ORDER — LORAZEPAM 2 MG/ML IJ SOLN: 0.0000 mg | Freq: Two times a day (BID) | INTRAMUSCULAR | Status: DC

## 2013-03-06 MED ORDER — DEXTROSE 5 % IV SOLN
1.0000 g | Freq: Once | INTRAVENOUS | Status: AC
  Administered 2013-03-06: 1 g via INTRAVENOUS
  Filled 2013-03-06: qty 10

## 2013-03-06 MED ORDER — LORAZEPAM 1 MG PO TABS: 0.0000 mg | ORAL_TABLET | Freq: Two times a day (BID) | ORAL | Status: DC

## 2013-03-06 MED ORDER — NICOTINE 7 MG/24HR TD PT24
7.0000 mg | MEDICATED_PATCH | Freq: Every day | TRANSDERMAL | Status: DC
  Filled 2013-03-06: qty 1

## 2013-03-06 MED ORDER — SODIUM CHLORIDE 0.9 % IV BOLUS (SEPSIS)
1000.0000 mL | INTRAVENOUS | Status: AC
  Administered 2013-03-06: 1000 mL via INTRAVENOUS

## 2013-03-06 MED ORDER — ACETAMINOPHEN 325 MG PO TABS: 650.0000 mg | ORAL_TABLET | Freq: Four times a day (QID) | ORAL | Status: DC | PRN

## 2013-03-06 MED ORDER — CIPROFLOXACIN HCL 500 MG PO TABS: 500.0000 mg | ORAL_TABLET | Freq: Two times a day (BID) | ORAL | Status: DC

## 2013-03-06 MED ORDER — LORAZEPAM 1 MG PO TABS
0.0000 mg | ORAL_TABLET | Freq: Four times a day (QID) | ORAL | Status: DC
  Administered 2013-03-07: 2 mg via ORAL
  Filled 2013-03-06: qty 2

## 2013-03-06 MED ORDER — LORAZEPAM 2 MG/ML IJ SOLN: 0.0000 mg | Freq: Four times a day (QID) | INTRAMUSCULAR | Status: DC

## 2013-03-06 MED ORDER — ONDANSETRON HCL 4 MG PO TABS: 4.0000 mg | ORAL_TABLET | Freq: Three times a day (TID) | ORAL | Status: DC | PRN

## 2013-03-06 MED ORDER — CEFPODOXIME PROXETIL 200 MG PO TABS
200.0000 mg | ORAL_TABLET | Freq: Two times a day (BID) | ORAL | Status: DC
  Administered 2013-03-07: 200 mg via ORAL
  Filled 2013-03-06 (×2): qty 1

## 2013-03-06 NOTE — BH Assessment (Signed)
Spoke with EDP Dr.Harrison to obtain clinicals prior to assessing pt.  Terri PeachNajah Issabela Lesko, MS, LCASA Assessment Counselor

## 2013-03-06 NOTE — ED Provider Notes (Addendum)
CSN: [id number]     Arrival date & time 03/06/13  1549 History   First MD Initiated Contact with Patient 03/06/13 1553     Chief Complaint  Patient presents with  . Drug Overdose  . Alcohol Intoxication  . Suicide Attempt   (Consider location/radiation/quality/duration/timing/severity/associated sxs/prior Treatment) Patient is a 53 y.o. female presenting with Overdose and intoxication. The history is provided by the patient, the EMS personnel and the police.  Drug Overdose This is a new problem. Episode onset: unknown. Episode frequency: unknown. The problem has not changed since onset.Pertinent negatives include no chest pain, no abdominal pain, no headaches and no shortness of breath. Nothing aggravates the symptoms. Nothing relieves the symptoms. She has tried nothing for the symptoms. The treatment provided no relief.  Alcohol Intoxication Pertinent negatives include no chest pain, no abdominal pain, no headaches and no shortness of breath.    Past Medical History  Diagnosis Date  . 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 History of elevated homocysteine   . Bipolar 1 disorder   . Anxiety   . Fibromyalgia   . Endometriosis   . Congenital single kidney   . Clotting disorder     hx of blood clot  . Cerebral hemorrhage   . Hemorrhoids   . Anal fissure    Past Surgical History  Procedure Laterality Date  . Cesarean section    . Tubal ligation    . Laparoscopy      endometriosis  . Breast surgery      benign breast lump removal  . Leep  2000  . Tonsillectomy    . Appendectomy     Family History  Problem Relation Age of Onset  . Coronary artery disease Other   . Stroke Other   . Hypertension Other   . Heart disease Mother   . Cancer Sister     breast  . Heart disease Brother    History  Substance Use Topics  . Smoking status: Current Every Day Smoker -- 1.50 packs/day    Types: Cigarettes  . Smokeless tobacco: Never Used  .  Alcohol Use: 4.7 oz/week    1 Drinks containing 0.5 oz of alcohol, 7 Cans of beer per week   OB History   Grav Para Term Preterm Abortions TAB SAB Ect Mult Living                 Review of Systems  Constitutional: Negative for fever and fatigue.  HENT: Negative for congestion and drooling.   Eyes: Negative for pain.  Respiratory: Negative for cough and shortness of breath.   Cardiovascular: Negative for chest pain.  Gastrointestinal: Negative for nausea, vomiting, abdominal pain and diarrhea.  Genitourinary: Negative for dysuria and hematuria.  Musculoskeletal: Negative for back pain, gait problem and neck pain.  Skin: Negative for color change.  Neurological: Negative for dizziness and headaches.  Hematological: Negative for adenopathy.  Psychiatric/Behavioral: Negative for behavioral problems.  All other systems reviewed and are negative.    Allergies  Acetaminophen and Compazine  Home Medications   Current Outpatient Rx  Name  Route  Sig  Dispense  Refill  . aspirin 325 MG tablet   Oral   Take 650 mg by mouth daily.         Marland Kitchen LORazepam (ATIVAN) 1 MG tablet   Oral   Take 0.5 tablets (0.5 mg total) by mouth 3 (three) times daily as needed for anxiety.  5 tablet   0    SpO2 98% Physical Exam  Nursing note and vitals reviewed. Constitutional: She appears well-developed and well-nourished.  HENT:  Head: Normocephalic.  Mouth/Throat: Oropharynx is clear and moist. No oropharyngeal exudate.  Eyes: Conjunctivae and EOM are normal. Pupils are equal, round, and reactive to light.  Neck: Normal range of motion. Neck supple.  Cardiovascular: Normal rate, regular rhythm, normal heart sounds and intact distal pulses.  Exam reveals no gallop and no friction rub.   No murmur heard. Pulmonary/Chest: Effort normal and breath sounds normal. No respiratory distress. She has no wheezes. She exhibits tenderness (mild/moderate tenderness to palpation of the lower sternum.).   Abdominal: Soft. Bowel sounds are normal. There is no tenderness. There is no rebound and no guarding.  Musculoskeletal: Normal range of motion. She exhibits no edema and no tenderness.  Neurological: She is alert.  Alert and oriented x2. The patient does not know the year.  She is interactive and follows commands. Normal strength and sensation diffusely.    She appears moderately intoxicated.  Skin: Skin is warm and dry.  Psychiatric: She has a normal mood and affect.    ED Course  Procedures (including critical care time) Labs Review Labs Reviewed  CBC WITH DIFFERENTIAL - Abnormal; Notable for the following:    MCH 34.6 (*)    Platelets 133 (*)    All other components within normal limits  COMPREHENSIVE METABOLIC PANEL - Abnormal; Notable for the following:    Sodium 134 (*)    BUN 4 (*)    Albumin 3.4 (*)    All other components within normal limits  SALICYLATE LEVEL - Abnormal; Notable for the following:    Salicylate Lvl <2.0 (*)    All other components within normal limits  ETHANOL - Abnormal; Notable for the following:    Alcohol, Ethyl (B) 295 (*)    All other components within normal limits  URINALYSIS, ROUTINE W REFLEX MICROSCOPIC - Abnormal; Notable for the following:    APPearance CLOUDY (*)    Nitrite POSITIVE (*)    Leukocytes, UA SMALL (*)    All other components within normal limits  URINE MICROSCOPIC-ADD ON - Abnormal; Notable for the following:    Bacteria, UA FEW (*)    All other components within normal limits  URINE CULTURE  ACETAMINOPHEN LEVEL  URINE RAPID DRUG SCREEN (HOSP PERFORMED)  POCT PREGNANCY, URINE   Imaging Review Dg Chest 1 View  03/06/2013   CLINICAL DATA:  Drug overdose.  Alcohol intoxication.  EXAM: CHEST - 1 VIEW  COMPARISON:  None.  FINDINGS: The heart size and mediastinal contours are within normal limits. Both lungs are clear. The visualized skeletal structures are unremarkable.  IMPRESSION: No active disease.   Electronically  Signed   By: Charlett Nose M.D.   On: 03/06/2013 17:05   Ct Head Wo Contrast  03/06/2013   CLINICAL DATA:  53 year old female status post suicide attempt, drug overdose/intoxication.  EXAM: CT HEAD WITHOUT CONTRAST  CT CERVICAL SPINE WITHOUT CONTRAST  TECHNIQUE: Multidetector CT imaging of the head and cervical spine was performed following the standard protocol without intravenous contrast. Multiplanar CT image reconstructions of the cervical spine were also generated.  COMPARISON:  Brain MRI 10/29/2006 and earlier.  FINDINGS: CT HEAD FINDINGS  Improved paranasal sinus aeration. Mastoids are clear. Mildly dysconjugate gaze, otherwise negative orbits soft tissues. Visualized scalp soft tissues are within normal limits. No acute osseous abnormality identified.  Mild Calcified atherosclerosis at the  skull base. Stable cerebral volume. Very mild subcortical white matter hypodensity is new, nonspecific. Otherwise normal gray-white matter differentiation. No suspicious intracranial vascular hyperdensity. No evidence of cortically based acute infarction identified. No midline shift, mass effect, or evidence of intracranial mass lesion. No acute intracranial hemorrhage identified.  CT CERVICAL SPINE FINDINGS  Straightening of cervical lordosis. Visualized skull base is intact. No atlanto-occipital dissociation. Cervicothoracic junction alignment is within normal limits. Bilateral posterior element alignment is within normal limits. No acute cervical spine fracture identified. Grossly intact visualized upper thoracic levels.  Negative lung apices.  Negative paraspinal soft tissues.  IMPRESSION: 1. No acute intracranial abnormality. Mild for age nonspecific subcortical white matter changes. 2. No acute fracture or listhesis identified in the cervical spine. Ligamentous injury is not excluded.   Electronically Signed   By: Augusto GambleLee  Hall M.D.   On: 03/06/2013 17:17   Ct Cervical Spine Wo Contrast  03/06/2013   CLINICAL DATA:   53 year old female status post suicide attempt, drug overdose/intoxication.  EXAM: CT HEAD WITHOUT CONTRAST  CT CERVICAL SPINE WITHOUT CONTRAST  TECHNIQUE: Multidetector CT imaging of the head and cervical spine was performed following the standard protocol without intravenous contrast. Multiplanar CT image reconstructions of the cervical spine were also generated.  COMPARISON:  Brain MRI 10/29/2006 and earlier.  FINDINGS: CT HEAD FINDINGS  Improved paranasal sinus aeration. Mastoids are clear. Mildly dysconjugate gaze, otherwise negative orbits soft tissues. Visualized scalp soft tissues are within normal limits. No acute osseous abnormality identified.  Mild Calcified atherosclerosis at the skull base. Stable cerebral volume. Very mild subcortical white matter hypodensity is new, nonspecific. Otherwise normal gray-white matter differentiation. No suspicious intracranial vascular hyperdensity. No evidence of cortically based acute infarction identified. No midline shift, mass effect, or evidence of intracranial mass lesion. No acute intracranial hemorrhage identified.  CT CERVICAL SPINE FINDINGS  Straightening of cervical lordosis. Visualized skull base is intact. No atlanto-occipital dissociation. Cervicothoracic junction alignment is within normal limits. Bilateral posterior element alignment is within normal limits. No acute cervical spine fracture identified. Grossly intact visualized upper thoracic levels.  Negative lung apices.  Negative paraspinal soft tissues.  IMPRESSION: 1. No acute intracranial abnormality. Mild for age nonspecific subcortical white matter changes. 2. No acute fracture or listhesis identified in the cervical spine. Ligamentous injury is not excluded.   Electronically Signed   By: Augusto GambleLee  Hall M.D.   On: 03/06/2013 17:17    EKG Interpretation    Date/Time:  Wednesday March 06 2013 23:42:34 EST Ventricular Rate:  95 PR Interval:  156 QRS Duration: 60 QT Interval:  364 QTC  Calculation: 458 R Axis:   12 Text Interpretation:  Sinus rhythm Anterior infarct, old Confirmed by Rayshawn Visconti  MD, Swan Zayed ([id number]) on 03/06/2013 11:46:22 PM            MDM   1. UTI (lower urinary tract infection)   2. Overdose of benzodiazepine   3. Alcohol intoxication    4:09 PM 53 y.o. female who presents with possible overdose. Per police and EMS the patient had reported to her family that she was suicidal earlier in the day. When I went to check on her, the family saw her on the ground at her kitchen to the window. EMS and police were called who found that the front door was barricaded. The patient was found on the floor the kitchen and was responsive and interactive. Xanax and ativan bottles were found at the scene. She is afebrile and vital signs are unremarkable here. She  is interactive on exam and alert and oriented x2, she does not know the year. She smells of etoh. She denies suicidal ideations. She cannot recall what happened. Will work up as OD, but will get imaging to r/o traumatic injury.   11:17 PM Patient now much more alert. She denies suicidal ideations or trying to harm herself by overdosing. TTS has seen the patient and recommends evaluation by the psychiatrist in the morning. The patient was found to have a UTI. She got 1 g of Rocephin IV here and will start her on Vantin 200 mg twice a day for 7 days. She notes she was recently admitted for a UTI, she denies urinary sx now, but notes that her low back was hurting  2 weeks ago. Will also place the patient on CIWA as family notes a drinking hx.   Junius Argyle, MD 03/06/13 2346  Junius Argyle, MD 03/06/13 [phone]
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
## 2013-03-06 NOTE — ED Notes (Signed)
Bed: WA04 Expected date:  Expected time:  Means of arrival:  Comments: Etoh/ativan/xanax intoxication

## 2013-03-06 NOTE — ED Notes (Signed)
Pt's sisterAudria Nine- Brenda Caudle [phone].

## 2013-03-06 NOTE — ED Notes (Signed)
Per EMS: Family member saw pt through back window, pt lying on floor not moving, so called 911. Upon EMS arrival, pt still lying down, responsive to verbal and tactile stimulation. With stimulation, pt became more responsive, oriented to name, not oriented to time or situation. Pt smells of ETOH, empty bottles of Ativan and Xanax found on the scene.

## 2013-03-06 NOTE — ED Notes (Signed)
Pt to go to behavioral health room 42.

## 2013-03-07 DIAGNOSIS — N390 Urinary tract infection, site not specified: Secondary | ICD-10-CM

## 2013-03-07 NOTE — Consult Note (Signed)
  Review of Systems  Constitutional: Negative.   HENT: Negative.   Eyes: Negative.   Respiratory: Negative.   Cardiovascular: Negative.   Gastrointestinal: Negative.   Genitourinary: Negative.   Musculoskeletal: Negative.   Skin: Negative.   Endo/Heme/Allergies: Negative.   Psychiatric/Behavioral: Positive for depression and substance abuse.   Depressed, has bacteria in her urine but no physical symptoms she says
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
## 2013-03-07 NOTE — ED Notes (Signed)
Pt is ambulatory, no signs of distress or verbal complaints at this time. Pt left with sister. Given d/c papers and treatment numbers. Denies si/hi/avh. Denies pain

## 2013-03-07 NOTE — Progress Notes (Signed)
[facility] provided pt with a [employer] Orange Card application, highlighting [facility].  °

## 2013-03-07 NOTE — [facility] Suicide Risk Assessment (Signed)
Suicide Risk Assessment  Discharge Assessment     Demographic Factors:  Caucasian, Low socioeconomic status, Living alone and Unemployed  Total Time spent with patient: 1 hour  Psychiatric Specialty Exam:     Blood pressure 141/100, pulse 89, temperature 98 F (36.7 C), temperature source Oral, resp. rate 19, SpO2 98.00%.There is no weight on file to calculate BMI.  General Appearance: Casual  Eye Contact::  Good  Speech:  Clear and Coherent  Volume:  Normal  Mood:  Depressed  Affect:  Appropriate  Thought Process:  Coherent and Logical  Orientation:  Full (Time, Place, and Person)  Thought Content:  Negative  Suicidal Thoughts:  No  Homicidal Thoughts:  No  Memory:  Immediate;   Good Recent;   Good Remote;   Good  Judgement:  Intact  Insight:  Fair  Psychomotor Activity:  Normal  Concentration:  Good  Recall:  Good  Fund of Knowledge:Good  Language: Good  Akathisia:  Negative  Handed:  Right  AIMS (if indicated):     Assets:  Communication Skills Desire for Improvement Housing Transportation  Sleep:       Musculoskeletal: Strength & Muscle Tone: within normal limits Gait & Station: normal Patient leans: N/A   Mental Status Per Nursing Assessment::   On Admission:     Current Mental Status by Physician: NA  Loss Factors: Financial problems/change in socioeconomic status  Historical Factors: NA  Risk Reduction Factors:   Sense of responsibility to family  Continued Clinical Symptoms:  Depression:   Anhedonia  Cognitive Features That Contribute To Risk:  none  Suicide Risk:  Minimal: No identifiable suicidal ideation.  Patients presenting with no risk factors but with morbid ruminations; may be classified as minimal risk based on the severity of the depressive symptoms  Discharge Diagnoses:   AXIS I:  Alcohol Abuse AXIS II:  Deferred AXIS III:   Past Medical History  Diagnosis Date  . 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 History of elevated homocysteine   . Bipolar 1 disorder   . Anxiety   . Fibromyalgia   . Endometriosis   . Congenital single kidney   . Clotting disorder     hx of blood clot  . Cerebral hemorrhage   . Hemorrhoids   . Anal fissure   . Depression    AXIS IV:  economic problems and other psychosocial or environmental problems AXIS V:  61-70 mild symptoms  Plan Of Care/Follow-up recommendations:  Activity:  no restrictions Diet:  no restrictions  Is patient on multiple antipsychotic therapies at discharge:  No   Has Patient had three or more failed trials of antipsychotic monotherapy by history:  No  Recommended Plan for Multiple Antipsychotic Therapies: NA    Jaz Mallick D 03/07/2013, 11:57 AM

## 2013-03-07 NOTE — Progress Notes (Signed)
Per, Dr. Ladona Ridgelaylor the patient will be discharged with referrals.

## 2013-03-08 LAB — URINE CULTURE

## 2013-03-10 ENCOUNTER — Telehealth (HOSPITAL_COMMUNITY): Payer: Self-pay | Admitting: Emergency Medicine

## 2013-03-10 NOTE — ED Notes (Signed)
Post ED Visit - Positive Culture Follow-up  Culture report reviewed by antimicrobial stewardship pharmacist: []  Wes Dulaney, Pharm.D., BCPS []  Celedonio MiyamotoJeremy Frens, Pharm.D., BCPS []  Georgina PillionElizabeth Martin, [address]Pharm[address]D., BCPS []  [place] BluffMinh Pham, [place]arm.D., BCPS, AAHIVP [x]  Estella HuskMichelle Turner, Pharm.D., BCPS, AAHIVP  Positive urine culture Treated with Vantin, organism sensitive to the same and no further patient follow-up is required at this time.  Zeb ComfortHolland, [place] 03/10/2013, 5:52 PM

## 2013-03-14 ENCOUNTER — Encounter (HOSPITAL_COMMUNITY): Payer: Self-pay | Admitting: Emergency Medicine

## 2013-03-14 ENCOUNTER — Emergency Department (HOSPITAL_COMMUNITY): Payer: Self-pay

## 2013-03-14 ENCOUNTER — Emergency Department (HOSPITAL_COMMUNITY)
Admission: EM | Admit: 2013-03-14 | Discharge: 2013-03-14 | Disposition: A | Discharge status: Home / Self Care | Payer: Self-pay | Attending: Emergency Medicine | Admitting: Emergency Medicine

## 2013-03-14 DIAGNOSIS — J159 Unspecified bacterial pneumonia: Secondary | ICD-10-CM | POA: Insufficient documentation

## 2013-03-14 DIAGNOSIS — Z8719 Personal history of other diseases of the digestive system: Secondary | ICD-10-CM | POA: Insufficient documentation

## 2013-03-14 DIAGNOSIS — Z9851 Tubal ligation status: Secondary | ICD-10-CM | POA: Insufficient documentation

## 2013-03-14 DIAGNOSIS — F17200 Nicotine dependence, unspecified, uncomplicated: Secondary | ICD-10-CM | POA: Insufficient documentation

## 2013-03-14 DIAGNOSIS — R071 Chest pain on breathing: Secondary | ICD-10-CM | POA: Insufficient documentation

## 2013-03-14 DIAGNOSIS — J189 Pneumonia, unspecified organism: Secondary | ICD-10-CM

## 2013-03-14 DIAGNOSIS — R109 Unspecified abdominal pain: Secondary | ICD-10-CM | POA: Insufficient documentation

## 2013-03-14 DIAGNOSIS — Z8659 Personal history of other mental and behavioral disorders: Secondary | ICD-10-CM | POA: Insufficient documentation

## 2013-03-14 DIAGNOSIS — Q602 Renal agenesis, unspecified: Secondary | ICD-10-CM | POA: Insufficient documentation

## 2013-03-14 DIAGNOSIS — Z8639 Personal history of other endocrine, nutritional and metabolic disease: Secondary | ICD-10-CM | POA: Insufficient documentation

## 2013-03-14 DIAGNOSIS — Q605 Renal hypoplasia, unspecified: Secondary | ICD-10-CM

## 2013-03-14 DIAGNOSIS — Z7982 Long term (current) use of aspirin: Secondary | ICD-10-CM | POA: Insufficient documentation

## 2013-03-14 DIAGNOSIS — R0789 Other chest pain: Secondary | ICD-10-CM

## 2013-03-14 DIAGNOSIS — Z792 Long term (current) use of antibiotics: Secondary | ICD-10-CM | POA: Insufficient documentation

## 2013-03-14 DIAGNOSIS — R059 Cough, unspecified: Secondary | ICD-10-CM

## 2013-03-14 DIAGNOSIS — Z862 Personal history of diseases of the blood and blood-forming organs and certain disorders involving the immune mechanism: Secondary | ICD-10-CM | POA: Insufficient documentation

## 2013-03-14 DIAGNOSIS — Z8679 Personal history of other diseases of the circulatory system: Secondary | ICD-10-CM | POA: Insufficient documentation

## 2013-03-14 DIAGNOSIS — M549 Dorsalgia, unspecified: Secondary | ICD-10-CM | POA: Insufficient documentation

## 2013-03-14 DIAGNOSIS — Z9889 Other specified postprocedural states: Secondary | ICD-10-CM | POA: Insufficient documentation

## 2013-03-14 DIAGNOSIS — Z86718 Personal history of other venous thrombosis and embolism: Secondary | ICD-10-CM | POA: Insufficient documentation

## 2013-03-14 DIAGNOSIS — R05 Cough: Secondary | ICD-10-CM

## 2013-03-14 DIAGNOSIS — Z8742 Personal history of other diseases of the female genital tract: Secondary | ICD-10-CM | POA: Insufficient documentation

## 2013-03-14 LAB — CBC WITH DIFFERENTIAL/PLATELET
BASOS PCT: 0 % (ref 0–1)
Basophils Absolute: 0 10*3/uL (ref 0.0–0.1)
Eosinophils Absolute: 0 10*3/uL (ref 0.0–0.7)
Eosinophils Relative: 0 % (ref 0–5)
HCT: 41.4 % (ref 36.0–46.0)
Hemoglobin: 15 g/dL (ref 12.0–15.0)
Lymphocytes Relative: 4 % — ABNORMAL LOW (ref 12–46)
Lymphs Abs: 0.5 10*3/uL — ABNORMAL LOW (ref 0.7–4.0)
MCH: 35.5 pg — ABNORMAL HIGH (ref 26.0–34.0)
MCHC: 36.2 g/dL — AB (ref 30.0–36.0)
MCV: 97.9 fL (ref 78.0–100.0)
MONO ABS: 1 10*3/uL (ref 0.1–1.0)
Monocytes Relative: 9 % (ref 3–12)
NEUTROS ABS: 10.2 10*3/uL — AB (ref 1.7–7.7)
NEUTROS PCT: 87 % — AB (ref 43–77)
PLATELETS: 146 10*3/uL — AB (ref 150–400)
RBC: 4.23 MIL/uL (ref 3.87–5.11)
RDW: 14.2 % (ref 11.5–15.5)
WBC: 11.7 10*3/uL — AB (ref 4.0–10.5)

## 2013-03-14 LAB — BASIC METABOLIC PANEL
BUN: 6 mg/dL (ref 6–23)
CO2: 22 mEq/L (ref 19–32)
CREATININE: 0.82 mg/dL (ref 0.50–1.10)
Calcium: 9.9 mg/dL (ref 8.4–10.5)
Chloride: 97 mEq/L (ref 96–112)
GFR calc non Af Amer: 81 mL/min — ABNORMAL LOW (ref >90)
Glucose, Bld: 123 mg/dL — ABNORMAL HIGH (ref 70–99)
POTASSIUM: 4.6 meq/L (ref 3.7–5.3)
Sodium: 138 mEq/L (ref 137–147)

## 2013-03-14 MED ORDER — IPRATROPIUM-ALBUTEROL 0.5-2.5 (3) MG/3ML IN SOLN
3.0000 mL | Freq: Once | RESPIRATORY_TRACT | Status: AC
  Administered 2013-03-14: 3 mL via RESPIRATORY_TRACT
  Filled 2013-03-14: qty 3

## 2013-03-14 MED ORDER — ALBUTEROL SULFATE HFA 108 (90 BASE) MCG/ACT IN AERS
2.0000 | INHALATION_SPRAY | Freq: Once | RESPIRATORY_TRACT | Status: AC
  Administered 2013-03-14: 2 via RESPIRATORY_TRACT
  Filled 2013-03-14: qty 6.7

## 2013-03-14 MED ORDER — SODIUM CHLORIDE 0.9 % IV BOLUS (SEPSIS)
1000.0000 mL | Freq: Once | INTRAVENOUS | Status: AC
  Administered 2013-03-14: 1000 mL via INTRAVENOUS

## 2013-03-14 MED ORDER — LORAZEPAM 1 MG PO TABS
0.5000 mg | ORAL_TABLET | Freq: Once | ORAL | Status: AC
  Administered 2013-03-14: 0.5 mg via ORAL
  Filled 2013-03-14: qty 1

## 2013-03-14 MED ORDER — OXYCODONE HCL 5 MG PO TABS: 5.0000 mg | ORAL_TABLET | ORAL | Status: DC | PRN

## 2013-03-14 MED ORDER — ONDANSETRON 4 MG PO TBDP
4.0000 mg | ORAL_TABLET | Freq: Once | ORAL | Status: AC
  Administered 2013-03-14: 4 mg via ORAL
  Filled 2013-03-14: qty 1

## 2013-03-14 MED ORDER — MORPHINE SULFATE 4 MG/ML IJ SOLN
4.0000 mg | Freq: Once | INTRAMUSCULAR | Status: AC
  Administered 2013-03-14: 4 mg via INTRAVENOUS
  Filled 2013-03-14: qty 1

## 2013-03-14 MED ORDER — LEVOFLOXACIN 750 MG PO TABS
750.0000 mg | ORAL_TABLET | Freq: Once | ORAL | Status: AC
  Administered 2013-03-14: 750 mg via ORAL
  Filled 2013-03-14: qty 1

## 2013-03-14 MED ORDER — KETOROLAC TROMETHAMINE 30 MG/ML IJ SOLN
30.0000 mg | Freq: Once | INTRAMUSCULAR | Status: AC
  Administered 2013-03-14: 30 mg via INTRAVENOUS
  Filled 2013-03-14: qty 1

## 2013-03-14 MED ORDER — ONDANSETRON HCL 4 MG/2ML IJ SOLN
4.0000 mg | Freq: Once | INTRAMUSCULAR | Status: AC
  Administered 2013-03-14: 4 mg via INTRAVENOUS
  Filled 2013-03-14: qty 2

## 2013-03-14 MED ORDER — LEVOFLOXACIN 750 MG PO TABS: 750.0000 mg | ORAL_TABLET | Freq: Every day | ORAL | Status: DC

## 2013-03-14 NOTE — ED Provider Notes (Signed)
CSN: [id number]     Arrival date & time 03/14/13  1512 History   First MD Initiated Contact with Patient 03/14/13 1520     Chief Complaint  Patient presents with  . Chest Pain   (Consider location/radiation/quality/duration/timing/severity/associated sxs/prior Treatment) Patient is a 53 y.o. female presenting with chest pain. The history is provided by the patient.  Chest Pain Pain location:  Substernal area Pain quality: sharp and shooting   Pain radiates to:  Does not radiate Pain radiates to the back: no   Pain severity:  Severe Onset quality:  Gradual Duration:  2 days Timing:  Constant Progression:  Worsening Chronicity:  New Context: lifting   Context comment:  Coughing Relieved by:  Nothing Worsened by:  Coughing, movement and deep breathing Associated symptoms: abdominal pain, back pain and shortness of breath   Associated symptoms: no anorexia, no diaphoresis, no dizziness, no dysphagia, no fever, no headache, no nausea, no near-syncope, no palpitations and not vomiting   Risk factors: smoking   Risk factors: no coronary artery disease, no diabetes mellitus, no high cholesterol, no hypertension and no prior DVT/PE   Risk factors comment:  History of venous sinus thrombosis   53 year old female with a chief complaint a chest pain. Patient states that this started a couple days ago and has progressively gotten worse. Patient states the pain is worse when she coughs worse when she takes a deep breath. Patient has been coughing pretty consistently for the past few months. Patient denies any fevers chills. Patient has had an increase of sputum production and had her sputum changed from clear to green. Patient denies a history of COPD. Patient endorses a 35-pack-year smoking history. Patient's pain is sharp right along the sternum reproduced with palpation. Pain is also worse with movement. Patient denies any prior coronary disease. Patient denies any history of hypertension  hyperlipidemia diabetes.    Past Medical History  Diagnosis Date  . 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 History of elevated homocysteine   . Bipolar 1 disorder   . Anxiety   . Fibromyalgia   . Endometriosis   . Congenital single kidney   . Clotting disorder     hx of blood clot  . Cerebral hemorrhage   . Hemorrhoids   . Anal fissure   . Depression    Past Surgical History  Procedure Laterality Date  . Cesarean section    . Tubal ligation    . Laparoscopy      endometriosis  . Breast surgery      benign breast lump removal  . Leep  2000  . Tonsillectomy    . Appendectomy     Family History  Problem Relation Age of Onset  . Coronary artery disease Other   . Stroke Other   . Hypertension Other   . Heart disease Mother   . Cancer Sister     breast  . Heart disease Brother    History  Substance Use Topics  . Smoking status: Current Every Day Smoker -- 2.00 packs/day    Types: Cigarettes  . Smokeless tobacco: Never Used  . Alcohol Use: 4.7 oz/week    7 Cans of beer, 1 Drinks containing 0.5 oz of alcohol per week   OB History   Grav Para Term Preterm Abortions TAB SAB Ect Mult Living                 Review of Systems  Constitutional:  Negative for fever, chills and diaphoresis.  HENT: Negative for congestion, rhinorrhea and trouble swallowing.   Eyes: Negative for redness and visual disturbance.  Respiratory: Positive for shortness of breath. Negative for wheezing.   Cardiovascular: Positive for chest pain. Negative for palpitations and near-syncope.  Gastrointestinal: Positive for abdominal pain. Negative for nausea, vomiting and anorexia.  Genitourinary: Negative for dysuria and urgency.  Musculoskeletal: Positive for back pain. Negative for arthralgias and myalgias.  Skin: Negative for pallor and wound.  Neurological: Negative for dizziness and headaches.    Allergies  Acetaminophen and Compazine  Home  Medications   Current Outpatient Rx  Name  Route  Sig  Dispense  Refill  . aspirin 325 MG tablet   Oral   Take 650 mg by mouth every 6 (six) hours as needed for mild pain or headache.          . levofloxacin (LEVAQUIN) 750 MG tablet   Oral   Take 1 tablet (750 mg total) by mouth daily.   4 tablet   0   . oxyCODONE (ROXICODONE) 5 MG immediate release tablet   Oral   Take 1 tablet (5 mg total) by mouth every 4 (four) hours as needed for severe pain.   10 tablet   0    BP 154/94  Pulse 100  Temp(Src) 98.7 F (37.1 C) (Oral)  Resp 21  SpO2 90% Physical Exam  Nursing note and vitals reviewed. Constitutional: She is oriented to person, place, and time. She appears well-developed and well-nourished. No distress.  HENT:  Head: Normocephalic and atraumatic.  Eyes: EOM are normal. Pupils are equal, round, and reactive to light. Right eye exhibits no discharge.  Neck: Normal range of motion. Neck supple.  Cardiovascular: Normal rate and regular rhythm.  Exam reveals no gallop and no friction rub.   No murmur heard. Pulmonary/Chest: Effort normal. No respiratory distress. She has no wheezes. She has no rales. She exhibits tenderness.  Abdominal: Soft. She exhibits no distension. There is no tenderness. There is no rebound and no guarding.  Musculoskeletal: She exhibits no edema and no tenderness.  Neurological: She is alert and oriented to person, place, and time.  Skin: Skin is warm and dry. She is not diaphoretic.  Psychiatric: She has a normal mood and affect. Her behavior is normal.    ED Course  Procedures (including critical care time) Labs Review Labs Reviewed  CBC WITH DIFFERENTIAL - Abnormal; Notable for the following:    WBC 11.7 (*)    MCH 35.5 (*)    MCHC 36.2 (*)    Platelets 146 (*)    Neutrophils Relative % 87 (*)    Neutro Abs 10.2 (*)    Lymphocytes Relative 4 (*)    Lymphs Abs 0.5 (*)    All other components within normal limits  BASIC METABOLIC PANEL  - Abnormal; Notable for the following:    Glucose, Bld 123 (*)    GFR calc non Af Amer 81 (*)    All other components within normal limits   Imaging Review Dg Chest 2 View  03/14/2013   CLINICAL DATA:  Chest pain, shortness of breath,  productive cough  EXAM: CHEST  2 VIEW  COMPARISON:  DG CHEST 1 VIEW dated 03/06/2013  FINDINGS: Ill-defined area of increased density projects in the region of right middle lobe. A second less conspicuous area projects within the region of the lingula. No focal regions of consolidation appreciated. Cardiac silhouette and mediastinal contours are unremarkable.  Osseous structures are unremarkable.  IMPRESSION: Bibasilar infiltrates versus atelectasis. Surveillance evaluation recommended status post appropriate therapeutic management.   Electronically Signed   By: Salome Holmes M.D.   On: 03/14/2013 16:00    EKG Interpretation   None       MDM   1. Community acquired pneumonia   2. Chest wall pain   3. Cough    63 1O female chief complaint of chest pain. Pain seems most likely secondary to COPD exacerbation. We'll obtain a chest x-ray to evaluate for pneumonia. Treat with a DuoNeb. No noted pneumonia will then give the steroids. Patient with significant history of venous sinus thrombosis.   Patient with reproducible chest pain most likely secondary to acute coughing. Pain reproducible with palpation. Feel that this is extremely unlikely to be a PE.  Patient's pain improved with Toradol and morphine. Patient with concern of pneumonia on chest x-ray. We'll treat with Levaquin here. Patient will continue course of Levaquin at home. Some improvement with a DuoNeb will give and albuterol inhaler to take home as well.  6:01 PM:  I have discussed the diagnosis/risks/treatment options with the patient and believe the pt to be eligible for discharge home to follow-up with PCP. We also discussed returning to the ED immediately if new or worsening sx occur. We discussed  the sx which are most concerning (e.g., Worsening shortness of breath) that necessitate immediate return. Medications administered to the patient during their visit and any new prescriptions provided to the patient are listed below.  Medications given during this visit Medications  albuterol (PROVENTIL HFA;VENTOLIN HFA) 108 (90 BASE) MCG/ACT inhaler 2 puff (not administered)  sodium chloride 0.9 % bolus 1,000 mL (0 mLs Intravenous Stopped 03/14/13 1724)  morphine 4 MG/ML injection 4 mg (4 mg Intravenous Given 03/14/13 1623)  ondansetron (ZOFRAN) injection 4 mg (4 mg Intravenous Given 03/14/13 1623)  ipratropium-albuterol (DUONEB) 0.5-2.5 (3) MG/3ML nebulizer solution 3 mL (3 mLs Nebulization Given 03/14/13 1623)  levofloxacin (LEVAQUIN) tablet 750 mg (750 mg Oral Given 03/14/13 1622)  LORazepam (ATIVAN) tablet 0.5 mg (0.5 mg Oral Given 03/14/13 1650)  ketorolac (TORADOL) 30 MG/ML injection 30 mg (30 mg Intravenous Given 03/14/13 1724)    New Prescriptions   LEVOFLOXACIN (LEVAQUIN) 750 MG TABLET    Take 1 tablet (750 mg total) by mouth daily.   OXYCODONE (ROXICODONE) 5 MG IMMEDIATE RELEASE TABLET    Take 1 tablet (5 mg total) by mouth every 4 (four) hours as needed for severe pain.     Melene Plan, MD 03/14/13 Flossie Buffy  Melene Plan, MD 03/14/13 [phone]

## 2013-03-14 NOTE — ED Notes (Signed)
According EMS, the patient called because she has been having pain in her chest when she takes a deep breath.  The patient said she tries to cough and it hurts her chest.  She rates her pain 10/10 when she coughs.  EMS placed an IV and they advised she took 650mg  of Aspirin prior to her arrival.  The patient was transported here to be evaluated.

## 2013-03-14 NOTE — ED Notes (Signed)
Patient is alert and orientedx4.  Patient was explained discharge instructions and they understood them with no questions.  The patient's sister, Audria NineBrenda Caudle is taking the patient home.

## 2013-03-14 NOTE — ED Provider Notes (Signed)
I saw and evaluated the patient, reviewed the resident's note and I agree with the findings and plan.  EKG Interpretation   None      EKG independently reviewed by myself: Sinus tachycardia with rate of 103, no evidence of acute ST elevation or ischemia  Patient presents with chest pain shortness of breath. Reports worsening cough over last 2-3 days. Cough is productive. She denies any fevers. She reports that the chest pain is worse with movement and coughing. She is chronically ill appearing on exam. No respiratory distress noted. No wheezes.  Anterior chest wall is tender to palpation and reproduces the patient's pain. Patient was given pain medication. Work up is notable for mild leukocytosis and chest x-ray concerning for infiltrates. Patient will be treated for community acquired pneumonia. Feel patient's chest pain is likely secondary to musculoskeletal etiology given recent cough and reproducibility on exam.    After history, exam, and medical workup I feel the patient has been appropriately medically screened and is safe for discharge home. Pertinent diagnoses were discussed with the patient. Patient was given return precautions.    Shon Batonourtney F Tenessa Marsee, MD 03/15/13 Marlyne Beards0002

## 2013-03-14 NOTE — Discharge Instructions (Signed)
Follow up with your PCP.  Take 4 over the counter ibuprofens three times a day for the next week.  Take the pain medicine as needed.  Return for worsening breathing status.  Chest Wall Pain Chest wall pain is pain in or around the bones and muscles of your chest. It may take up to 6 weeks to get better. It may take longer if you must stay physically active in your work and activities.  CAUSES  Chest wall pain may happen on its own. However, it may be caused by:  A viral illness like the flu.  Injury.  Coughing.  Exercise.  Arthritis.  Fibromyalgia.  Shingles. HOME CARE INSTRUCTIONS   Avoid overtiring physical activity. Try not to strain or perform activities that cause pain. This includes any activities using your chest or your abdominal and side muscles, especially if heavy weights are used.  Put ice on the sore area.  Put ice in a plastic bag.  Place a towel between your skin and the bag.  Leave the ice on for 15-20 minutes per hour while awake for the first 2 days.  Only take over-the-counter or prescription medicines for pain, discomfort, or fever as directed by your caregiver. SEEK IMMEDIATE MEDICAL CARE IF:   Your pain increases, or you are very uncomfortable.  You have a fever.  Your chest pain becomes worse.  You have new, unexplained symptoms.  You have nausea or vomiting.  You feel sweaty or lightheaded.  You have a cough with phlegm (sputum), or you cough up blood. MAKE SURE YOU:   Understand these instructions.  Will watch your condition.  Will get help right away if you are not doing well or get worse. Document Released: 01/24/2005 Document Revised: 04/18/2011 Document Reviewed: 09/20/2010 [facility] Patient Information 2014 [place], [place].  Pneumonia, Adult Pneumonia is an infection of the lungs.  CAUSES Pneumonia may be caused by bacteria or a virus. Usually, these infections are caused by breathing infectious particles into the lungs  (respiratory tract). SYMPTOMS   Cough.  Fever.  Chest pain.  Increased rate of breathing.  Wheezing.  Mucus production. DIAGNOSIS  If you have the common symptoms of pneumonia, your caregiver will typically confirm the diagnosis with a chest X-ray. The X-ray will show an abnormality in the lung (pulmonary infiltrate) if you have pneumonia. Other tests of your blood, urine, or sputum may be done to find the specific cause of your pneumonia. Your caregiver may also do tests (blood gases or pulse oximetry) to see how well your lungs are working. TREATMENT  Some forms of pneumonia may be spread to other people when you cough or sneeze. You may be asked to wear a mask before and during your exam. Pneumonia that is caused by bacteria is treated with antibiotic medicine. Pneumonia that is caused by the influenza virus may be treated with an antiviral medicine. Most other viral infections must run their course. These infections will not respond to antibiotics.  PREVENTION A pneumococcal shot (vaccine) is available to prevent a common bacterial cause of pneumonia. This is usually suggested for:  People over 52 years old.  Patients on chemotherapy.  People with chronic lung problems, such as bronchitis or emphysema.  People with immune system problems. If you are over 65 or have a high risk condition, you may receive the pneumococcal vaccine if you have not received it before. In some countries, a routine influenza vaccine is also recommended. This vaccine can help prevent some cases of pneumonia.You  may be offered the influenza vaccine as part of your care. If you smoke, it is time to quit. You may receive instructions on how to stop smoking. Your caregiver can provide medicines and counseling to help you quit. HOME CARE INSTRUCTIONS   Cough suppressants may be used if you are losing too much rest. However, coughing protects you by clearing your lungs. You should avoid using cough  suppressants if you can.  Your caregiver may have prescribed medicine if he or she thinks your pneumonia is caused by a bacteria or influenza. Finish your medicine even if you start to feel better.  Your caregiver may also prescribe an expectorant. This loosens the mucus to be coughed up.  Only take over-the-counter or prescription medicines for pain, discomfort, or fever as directed by your caregiver.  Do not smoke. Smoking is a common cause of bronchitis and can contribute to pneumonia. If you are a smoker and continue to smoke, your cough may last several weeks after your pneumonia has cleared.  A cold steam vaporizer or humidifier in your room or home may help loosen mucus.  Coughing is often worse at night. Sleeping in a semi-upright position in a recliner or using a couple pillows under your head will help with this.  Get rest as you feel it is needed. Your body will usually let you know when you need to rest. SEEK IMMEDIATE MEDICAL CARE IF:   Your illness becomes worse. This is especially true if you are elderly or weakened from any other disease.  You cannot control your cough with suppressants and are losing sleep.  You begin coughing up blood.  You develop pain which is getting worse or is uncontrolled with medicines.  You have a fever.  Any of the symptoms which initially brought you in for treatment are getting worse rather than better.  You develop shortness of breath or chest pain. MAKE SURE YOU:   Understand these instructions.  Will watch your condition.  Will get help right away if you are not doing well or get worse. Document Released: 01/24/2005 Document Revised: 04/18/2011 Document Reviewed: 04/15/2010 [facility] Patient Information 2014 [place], [place].

## 2013-03-14 NOTE — ED Notes (Cosign Needed Addendum)
The vitals that were validated at 1515 were not the right vitals for this patient.  Cannot delete it once it is validated.  Charted on the wrong patient.

## 2013-03-15 NOTE — ED Provider Notes (Signed)
I saw and evaluated the patient, reviewed the resident's note and I agree with the findings and plan.  EKG Interpretation   None       See separate note for details.  Shon Batonourtney F Ulysess Witz, MD 03/15/13 [phone]

## 2013-03-18 ENCOUNTER — Telehealth: Payer: Self-pay | Admitting: Family Medicine

## 2013-03-18 NOTE — Telephone Encounter (Signed)
I tried to call pt but the number in chart was not working. Can you see if there was another number and give pt below message?

## 2013-03-18 NOTE — Telephone Encounter (Signed)
NO I cannot give her medications without seeing her

## 2013-03-18 NOTE — Telephone Encounter (Signed)
Tried calling pt back #number is not working, the telephone # was verified with pt

## 2013-03-18 NOTE — Telephone Encounter (Signed)
[facility] and released on 03/16/13, pt states that the doctor is requesting a follow up with dr. Clent RidgesFry within 4 days however because she doesn't have health insurance she wants to know if dr fry can fill her prescriptions without seeing her or does she have to come in for the follow up visit.

## 2013-03-18 NOTE — Telephone Encounter (Signed)
error 

## 2013-03-25 ENCOUNTER — Ambulatory Visit: Payer: Self-pay | Admitting: Family Medicine

## 2013-03-28 ENCOUNTER — Ambulatory Visit (INDEPENDENT_AMBULATORY_CARE_PROVIDER_SITE_OTHER): Payer: Self-pay | Admitting: Family Medicine

## 2013-03-28 ENCOUNTER — Encounter: Payer: Self-pay | Admitting: Family Medicine

## 2013-03-28 VITALS — BP 124/80 | HR 60 | Temp 98.1°F | Ht 62.0 in | Wt 99.0 lb

## 2013-03-28 DIAGNOSIS — N261 Atrophy of kidney (terminal): Secondary | ICD-10-CM

## 2013-03-28 DIAGNOSIS — R079 Chest pain, unspecified: Secondary | ICD-10-CM

## 2013-03-28 DIAGNOSIS — N269 Renal sclerosis, unspecified: Secondary | ICD-10-CM

## 2013-03-28 MED ORDER — LORAZEPAM 1 MG PO TABS: 1.0000 mg | ORAL_TABLET | Freq: Four times a day (QID) | ORAL | Status: DC | PRN

## 2013-03-28 MED ORDER — OXYCODONE HCL 5 MG PO TABS: 5.0000 mg | ORAL_TABLET | Freq: Four times a day (QID) | ORAL | Status: DC | PRN

## 2013-03-28 NOTE — Progress Notes (Signed)
   Subjective:    Patient ID: [name], female    DOB: 01/20/1961, 53 y.o.   MRN: [id number]  HPI Here to follow up an ER visit on 03-14-13 for chest pain and a cough. At that time she had been coughing up yellow sputum for about a week, she was SOB, and she had pleuritic chest pains. No fever. Her lungs were clear on exam. A CXR showed bibasilar shadows which were felt to represent either infiltrates or atelectasis, and there was a density seen in the RML. She was given a diagnosis of pneumonia and was given 4 days of Levaquin. She took these and today she says the cough has greatly improved. However she is still very SOB and has severe sharp pains across the chest when she takes a deep breath. Still no fevers. She has been a heavy smoker for years. 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  I cannot find a baseline CXR in her records to compare with. She asks for something for the pain and she asks for something to calm her nerves. She says she is extremely anxious most of the time. She has a diagnosis of bipolar disorder but she is not being treated for this.    Review of Systems  Constitutional: Negative.   HENT: Negative.   Respiratory: Positive for cough, chest tightness and shortness of breath. Negative for wheezing.   Cardiovascular: Negative for palpitations and leg swelling.  Psychiatric/Behavioral: Negative for hallucinations, behavioral problems, confusion, dysphoric mood, decreased concentration and agitation. The patient is nervous/anxious.        Objective:   Physical Exam  Constitutional:  In pain, taking shallow breaths  Neck: No thyromegaly present.  Cardiovascular: Normal rate, regular rhythm, normal heart sounds and intact distal pulses.   Pulmonary/Chest: Effort normal and breath sounds normal. No respiratory distress. She has no wheezes. She has no rales.  She is extremely tender across the entire chest, no bruising is visible  Lymphadenopathy:    She has no cervical  adenopathy.  Psychiatric: Her behavior is normal. Thought content normal.  Very anxious and shaky           Assessment & Plan:  It is not clear what her underlying problem is at this point. She does not seem to have a pneumonia. With her recent CXR abnormalities I am concerned about the possibility of a PE or of neoplasm. We will set up a contrasted chest CT today or tomorrow to get a better look. Given some Lorazepam and some Oxycodone.

## 2013-03-29 ENCOUNTER — Telehealth: Payer: Self-pay | Admitting: Family Medicine

## 2013-03-29 NOTE — Telephone Encounter (Signed)
Relevant patient education mailed to patient.  

## 2013-04-01 ENCOUNTER — Telehealth: Payer: Self-pay | Admitting: Family Medicine

## 2013-04-01 NOTE — Addendum Note (Signed)
Addended by: Gershon CraneFRY, STEPHEN A on: 04/01/2013 09:39 AM   Modules accepted: Orders

## 2013-04-01 NOTE — Telephone Encounter (Signed)
Pt would like chest ct scan results

## 2013-04-01 NOTE — Telephone Encounter (Signed)
Her CT revealed no pneumonia so she does not need to be treated for this

## 2013-04-01 NOTE — Telephone Encounter (Signed)
I spoke with pt and went over the below information. 

## 2013-04-01 NOTE — Telephone Encounter (Signed)
Pt would like to know if she needs medication for her PNA??

## 2013-04-01 NOTE — Telephone Encounter (Signed)
I spoke with pt and gave results.  

## 2013-04-01 NOTE — Addendum Note (Signed)
Addended by: Gershon CraneFRY, Fallan Mccarey A on: 04/01/2013 10:04 AM   Modules accepted: Orders

## 2013-04-18 ENCOUNTER — Encounter: Payer: Self-pay | Admitting: Family Medicine

## 2013-10-22 ENCOUNTER — Telehealth: Payer: Self-pay | Admitting: Hematology and Oncology

## 2013-10-22 ENCOUNTER — Telehealth: Payer: Self-pay | Admitting: Hematology

## 2013-10-22 NOTE — Telephone Encounter (Signed)
C/D 10/22/13 for appt. 10/31/13

## 2013-10-22 NOTE — Telephone Encounter (Signed)
S/W PATIENT AND GAVE NP APPT FOR 09/24 @ 10:30 W/DR. SEHBAI.  REFERRING DR. Okey Regal WEBB DX- MACROCYTOSIS

## 2013-10-31 ENCOUNTER — Ambulatory Visit: Payer: Self-pay | Admitting: Hematology

## 2013-10-31 ENCOUNTER — Encounter: Payer: Self-pay | Admitting: Hematology

## 2013-10-31 ENCOUNTER — Ambulatory Visit: Payer: Self-pay

## 2013-10-31 VITALS — BP 139/77 | HR 89 | Temp 98.2°F | Resp 18 | Ht 62.0 in | Wt 102.4 lb

## 2013-10-31 DIAGNOSIS — F411 Generalized anxiety disorder: Secondary | ICD-10-CM

## 2013-10-31 MED ORDER — LORAZEPAM 0.5 MG PO TABS: 0.5000 mg | ORAL_TABLET | Freq: Every day | ORAL | Status: DC

## 2013-10-31 NOTE — Progress Notes (Signed)
Checked in new patient. She has not insurance. She applies thru [employer] and [place] and has been denied because she has fund from her hubby. She knows she will be self pay and I advised her she will have to set up pmts with billing. She said she is living off of her checking--saved up money. No job and no one helping her.

## 2013-11-04 NOTE — Progress Notes (Signed)
A letter send to provider. No progress note for this visit.  Cay Schillings, MD Medical Hematologist/Oncologist [facility] Pager: [phone] Office No: [phone]

## 2014-06-23 ENCOUNTER — Encounter: Payer: Self-pay | Admitting: Family Medicine

## 2014-06-23 ENCOUNTER — Ambulatory Visit (INDEPENDENT_AMBULATORY_CARE_PROVIDER_SITE_OTHER): Payer: Self-pay | Admitting: Family Medicine

## 2014-06-23 VITALS — BP 122/74 | HR 124 | Temp 98.5°F | Ht 62.0 in | Wt 103.0 lb

## 2014-06-23 DIAGNOSIS — F411 Generalized anxiety disorder: Secondary | ICD-10-CM

## 2014-06-23 DIAGNOSIS — N390 Urinary tract infection, site not specified: Secondary | ICD-10-CM

## 2014-06-23 DIAGNOSIS — A630 Anogenital (venereal) warts: Secondary | ICD-10-CM

## 2014-06-23 DIAGNOSIS — R300 Dysuria: Secondary | ICD-10-CM

## 2014-06-23 DIAGNOSIS — S93402A Sprain of unspecified ligament of left ankle, initial encounter: Secondary | ICD-10-CM

## 2014-06-23 LAB — POCT URINALYSIS DIPSTICK
BILIRUBIN UA: NEGATIVE
Blood, UA: NEGATIVE
Glucose, UA: NEGATIVE
KETONES UA: 40
NITRITE UA: NEGATIVE
Protein, UA: NEGATIVE
Spec Grav, UA: 1.01
Urobilinogen, UA: 0.2
pH, UA: 6

## 2014-06-23 MED ORDER — LORAZEPAM 1 MG PO TABS: 1.0000 mg | ORAL_TABLET | Freq: Four times a day (QID) | ORAL | Status: DC | PRN

## 2014-06-23 NOTE — Progress Notes (Signed)
Pre visit review using our clinic review tool, if applicable. No additional management support is needed unless otherwise documented below in the visit note. 

## 2014-06-23 NOTE — Progress Notes (Signed)
   Subjective:    Patient ID: [name], female    DOB: 1960/07/18, 54 y.o.   MRN: [id number]  HPI Here for several things. First she has had 2 lumps on her pubic area for 6 months that are asymptomatic but growing larger. Also she had several days of burning on urination but this hast stopped. No urgency. Also she stepped in a hole in the ground last week and twisted her left ankle. It has been swollen and painful since then. Last she asks me to refill her Lorazepam.    Review of Systems  Constitutional: Negative.   Gastrointestinal: Negative.   Genitourinary: Positive for dysuria. Negative for urgency, frequency, hematuria and flank pain.  Musculoskeletal: Positive for joint swelling and arthralgias.  Psychiatric/Behavioral: Negative for dysphoric mood. The patient is nervous/anxious.        Objective:   Physical Exam  Constitutional: She is oriented to person, place, and time. She appears well-developed and well-nourished.  Abdominal: Soft. Bowel sounds are normal. She exhibits no distension and no mass. There is no tenderness. There is no rebound and no guarding.  Neurological: She is alert and oriented to person, place, and time.  Skin:  2 verrucous warts on the mons pubis  Psychiatric: Her behavior is normal. Thought content normal.  Anxious           Assessment & Plan:  We refilled her Lorazepam for the anxiety. She needs to see her GYN to treat the warts and also to check internally for vaginal lesions. For the ankle sprain she can wear a Neoprene support sleeve. We will culture the urine sample to determine if she has a UTI or not.

## 2014-06-25 LAB — URINE CULTURE: Colony Count: 15000

## 2015-02-11 ENCOUNTER — Other Ambulatory Visit: Payer: Self-pay | Admitting: Family Medicine

## 2015-02-11 NOTE — Telephone Encounter (Signed)
Pt requesting LORazepam (ATIVAN) 1 MG tablet Pt last visit 06/23/14 Pt last refill 06/23/14 #120 with 5 refills

## 2015-02-11 NOTE — Telephone Encounter (Signed)
Call in #120 with 5 rf 

## 2015-03-18 DIAGNOSIS — R569 Unspecified convulsions: Secondary | ICD-10-CM

## 2015-03-18 HISTORY — DX: Unspecified convulsions: R56.9

## 2015-03-26 ENCOUNTER — Telehealth: Payer: Self-pay | Admitting: Family Medicine

## 2015-03-26 NOTE — Telephone Encounter (Signed)
Pt last saw dr fry may 2016. Pt had ?seizure last Thursday the paramedic saw her. Pt did not go to er. Pt said her symptoms was weakness, fatigue and shakiness . Pt fall out on ground her eyes rolled in back of her head. Pt could not remember  who president nor what year it is the patient also could not remember how old she is.   Pt would like a referral to neurologist. Pt has seen dr Pearlean Brownie for blood clots in brain. Pt would like to see dr Pearlean Brownie again. Pt does not have insurance.

## 2015-03-27 NOTE — Telephone Encounter (Signed)
I cannot refer her to anyone until I can evaluate her myself. She needs an OV with me ASAP.

## 2015-03-30 NOTE — Telephone Encounter (Signed)
Patient scheduled appt tomorrow 03/31/15 at 11:00 am

## 2015-03-31 ENCOUNTER — Encounter: Payer: Self-pay | Admitting: Family Medicine

## 2015-03-31 ENCOUNTER — Ambulatory Visit (INDEPENDENT_AMBULATORY_CARE_PROVIDER_SITE_OTHER): Payer: Self-pay | Admitting: Family Medicine

## 2015-03-31 VITALS — BP 120/74 | Temp 98.4°F | Ht 62.0 in | Wt 102.0 lb

## 2015-03-31 DIAGNOSIS — R569 Unspecified convulsions: Secondary | ICD-10-CM

## 2015-03-31 LAB — POC URINALSYSI DIPSTICK (AUTOMATED)
BILIRUBIN UA: NEGATIVE
Glucose, UA: NEGATIVE
Ketones, UA: NEGATIVE
NITRITE UA: POSITIVE
Spec Grav, UA: 1.02
UROBILINOGEN UA: 1
pH, UA: 5.5

## 2015-03-31 LAB — HEPATIC FUNCTION PANEL
ALK PHOS: 67 U/L (ref 39–117)
ALT: 9 U/L (ref 0–35)
AST: 15 U/L (ref 0–37)
Albumin: 4.7 g/dL (ref 3.5–5.2)
BILIRUBIN TOTAL: 0.8 mg/dL (ref 0.2–1.2)
Bilirubin, Direct: 0.1 mg/dL (ref 0.0–0.3)
Total Protein: 7.6 g/dL (ref 6.0–8.3)

## 2015-03-31 LAB — CBC WITH DIFFERENTIAL/PLATELET
Basophils Absolute: 0 10*3/uL (ref 0.0–0.1)
Basophils Relative: 0.3 % (ref 0.0–3.0)
EOS ABS: 0.1 10*3/uL (ref 0.0–0.7)
Eosinophils Relative: 0.5 % (ref 0.0–5.0)
HEMATOCRIT: 44.4 % (ref 36.0–46.0)
HEMOGLOBIN: 15.4 g/dL — AB (ref 12.0–15.0)
LYMPHS PCT: 13.7 % (ref 12.0–46.0)
Lymphs Abs: 1.4 10*3/uL (ref 0.7–4.0)
MCHC: 34.7 g/dL (ref 30.0–36.0)
MCV: 103.8 fl — AB (ref 78.0–100.0)
MONOS PCT: 7.6 % (ref 3.0–12.0)
Monocytes Absolute: 0.8 10*3/uL (ref 0.1–1.0)
NEUTROS ABS: 8.1 10*3/uL — AB (ref 1.4–7.7)
Neutrophils Relative %: 77.9 % — ABNORMAL HIGH (ref 43.0–77.0)
PLATELETS: 262 10*3/uL (ref 150.0–400.0)
RBC: 4.27 Mil/uL (ref 3.87–5.11)
RDW: 15.9 % — ABNORMAL HIGH (ref 11.5–15.5)
WBC: 10.4 10*3/uL (ref 4.0–10.5)

## 2015-03-31 LAB — BASIC METABOLIC PANEL
BUN: 10 mg/dL (ref 6–23)
CALCIUM: 10.7 mg/dL — AB (ref 8.4–10.5)
CO2: 29 mEq/L (ref 19–32)
Chloride: 105 mEq/L (ref 96–112)
Creatinine, Ser: 1.14 mg/dL (ref 0.40–1.20)
GFR: 52.88 mL/min — AB (ref >60.00)
Glucose, Bld: 90 mg/dL (ref 70–99)
Potassium: 4.6 mEq/L (ref 3.5–5.1)
SODIUM: 143 meq/L (ref 135–145)

## 2015-03-31 LAB — MAGNESIUM: MAGNESIUM: 2.1 mg/dL (ref 1.5–2.5)

## 2015-03-31 LAB — TSH: TSH: 2.07 u[IU]/mL (ref 0.35–4.50)

## 2015-03-31 NOTE — Progress Notes (Signed)
Pre visit review using our clinic review tool, if applicable. No additional management support is needed unless otherwise documented below in the visit note. 

## 2015-03-31 NOTE — Progress Notes (Signed)
   Subjective:    Patient ID: [name], female    DOB: 1961-01-30, 55 y.o.   MRN: [id number]  HPI Here for an apparent seizure on 03-19-15. This was observed by her sister, but [name] does not remember much of anything. The two of them had been shopping that day, and they got home in the early afternoon. As [name] was lifting a case of drinks out of the trunkl she began to feel bad and asked her sister to help her. After that [name] has no memory until she remembers lying on her couch with EMS staff working on her. According to her sister, [name] fell backward to the ground, clenched her body tightly and then began to shake all over. Her eyes rolled back in her head and she "foamed at the mouth". She was unresponsive at this point. EMS was called and arrived after the shaking had stopped (total time frame was 5-10 minues). Her vital signs were normal and her glucose was normal. They helped her to her house and she slowly came around. She was very fatigued and "out of it" that entire afternoon and she slept a lot. By the next day she was fine. She has had no such symptoms ever since that day. No headache or SOB or dizziness or nausea. She asks me to refer her to Dr. Pearlean Brownie, since she saw him in 2008 for what she terms "blood clots in my brain". Her chart mentions a cerebral hemorrhage, but I cannot find any details about this time period. She states that she has been under extreme stress recently and she cannot sleep. She is using Lorazepam prn.    Review of Systems  Constitutional: Negative.   Eyes: Negative.   Respiratory: Negative.   Cardiovascular: Negative.   Neurological: Positive for seizures. Negative for dizziness, tremors, syncope, facial asymmetry, speech difficulty, weakness, light-headedness, numbness and headaches.       Objective:   Physical Exam  Constitutional: She is oriented to person, place, and time. She appears well-developed and well-nourished. No distress.  HENT:  Head:  Normocephalic and atraumatic.  Right Ear: External ear normal.  Left Ear: External ear normal.  Nose: Nose normal.  Mouth/Throat: Oropharynx is clear and moist.  Eyes: Conjunctivae and EOM are normal. Pupils are equal, round, and reactive to light.  Neck: Neck supple. No thyromegaly present.  Cardiovascular: Normal rate, regular rhythm, normal heart sounds and intact distal pulses.   Pulmonary/Chest: Effort normal and breath sounds normal.  Lymphadenopathy:    She has no cervical adenopathy.  Neurological: She is alert and oriented to person, place, and time. She has normal reflexes. No cranial nerve deficit. She exhibits normal muscle tone. Coordination normal.          Assessment & Plan:  Probable recent seizure. Get labs today and set up a brain MRI soon. Refer to Neurology

## 2015-04-10 ENCOUNTER — Ambulatory Visit
Admission: RE | Admit: 2015-04-10 | Discharge: 2015-04-10 | Disposition: A | Discharge status: Home / Self Care | Payer: No Typology Code available for payment source | Source: Ambulatory Visit | Attending: Family Medicine | Admitting: Family Medicine

## 2015-04-10 DIAGNOSIS — R569 Unspecified convulsions: Secondary | ICD-10-CM

## 2015-04-10 MED ORDER — GADOBENATE DIMEGLUMINE 529 MG/ML IV SOLN
9.0000 mL | Freq: Once | INTRAVENOUS | Status: AC | PRN
  Administered 2015-04-10: 9 mL via INTRAVENOUS

## 2015-04-14 ENCOUNTER — Encounter: Payer: Self-pay | Admitting: Diagnostic Neuroimaging

## 2015-04-14 ENCOUNTER — Ambulatory Visit (INDEPENDENT_AMBULATORY_CARE_PROVIDER_SITE_OTHER): Payer: Self-pay | Admitting: Diagnostic Neuroimaging

## 2015-04-14 VITALS — BP 99/73 | HR 115 | Ht 61.0 in | Wt 104.4 lb

## 2015-04-14 DIAGNOSIS — R569 Unspecified convulsions: Secondary | ICD-10-CM

## 2015-04-14 DIAGNOSIS — G44319 Acute post-traumatic headache, not intractable: Secondary | ICD-10-CM

## 2015-04-14 DIAGNOSIS — F411 Generalized anxiety disorder: Secondary | ICD-10-CM

## 2015-04-14 NOTE — Progress Notes (Signed)
[facility]  PATIENT: [name] DOB: [birth year]  REFERRING CLINICIAN: Clent Ridges HISTORY FROM: patient and friend Elijah Birk) REASON FOR VISIT: new consult    HISTORICAL  CHIEF COMPLAINT:  Chief Complaint  Patient presents with  . Convulsions    rm 6, New pt, friend-Tom, "headache, neck and left ear pain since seizure, increased anxiety"    HISTORY OF PRESENT ILLNESS:   55 year old female here for evaluation of possible seizure.  03/19/15 patient was at home, unloading her car when all of a sudden she did not feel well. Patient had some nausea, told her sister and then without warning patient fell to the ground. Her eyes rolled back, lips turn blue, body to rule out, and had shaking and foaming at mouth for several minutes. Paramedics were called and patient was evaluated. Apparently her vital signs and blood sugar were unremarkable. Patient did not go to the hospital on the day of seizure. Over the next few days patient felt tired, fatigue, "mind scrambled".  Of note in 2008 patient had headache, papilledema, confusion was diagnosed with right transverse and sigmoid sinus thrombosis. 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 Patient continues to have headache, neck stiffness, memory loss, mood swings, anxiety, depression, memory loss since 2008.    REVIEW OF SYSTEMS: Full 14 system review of systems performed and negative with exception of: memory loss confusion headache dizziness seizure sleepiness depression anxiety racing thoughts fatigue.  ALLERGIES: Allergies  Allergen Reactions  . Acetaminophen Other (See Comments)    Patient stated, " I overdosed on it."  . Compazine Other (See Comments)    Convulsions/shaking and couldn't swallow  anything    HOME MEDICATIONS: Outpatient Prescriptions Prior to Visit  Medication Sig Dispense Refill  . aspirin 325 MG tablet Take 325 mg by mouth every 6 (six) hours as needed for mild pain or headache.     Marland Kitchen LORazepam (ATIVAN) 1 MG tablet TAKE 1 TABLET BY MOUTH EVERY 6 HOURS AS NEEDED FOR ANXIETY 120 tablet 5   No facility-administered medications prior to visit.    PAST MEDICAL HISTORY: Past Medical History  Diagnosis Date  . 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 History of elevated homocysteine   . Bipolar 1 disorder (HCC)   . Anxiety   . Fibromyalgia   . Endometriosis   . Congenital single kidney   . Clotting disorder (HCC)     hx of blood clot  . Cerebral hemorrhage (HCC)   . Hemorrhoids   . Anal fissure   . Depression   . Seizures (HCC) 03/18/15    PAST SURGICAL HISTORY: Past Surgical History  Procedure Laterality Date  . Cesarean section    . Tubal ligation    . Laparoscopy      endometriosis  . Breast surgery      benign breast lump removal  . Leep  2000  . Tonsillectomy    . Appendectomy      FAMILY HISTORY: Family History  Problem Relation Age of Onset  . Coronary artery disease Other   . Stroke Other   . Hypertension Other   . Heart disease Mother   . Cancer Sister     breast  . Heart disease Brother     SOCIAL HISTORY:  Social History  Social History  . Marital Status: Divorced    Spouse Name: N/A  . Number of Children: 2  . Years of Education: 12   Occupational History  .      home maker   Social History Main Topics  . Smoking status: Current Every Day Smoker -- 1.00 packs/day    Types: Cigarettes  . Smokeless tobacco: Never Used  . Alcohol Use: 0.0 oz/week    0 Standard drinks or equivalent per week     Comment: occ  . Drug Use: No  . Sexual Activity: Not on file     Comment: unknown   Other Topics Concern  . Not on file   Social History Narrative     PHYSICAL EXAM  GENERAL  EXAM/CONSTITUTIONAL: Vitals:  Filed Vitals:   04/14/15 0937  BP: 99/73  Pulse: 115  Height: 5\' 1"  (1.549 m)  Weight: 104 lb 6.4 oz (47.356 kg)     Body mass index is 19.74 kg/(m^2).  Visual Acuity Screening   Right eye Left eye Both eyes  Without correction: 20/30 20/30   With correction:        Patient is in no distress; well developed, nourished and groomed; neck is supple  CARDIOVASCULAR:  Examination of carotid arteries is normal; no carotid bruits  Regular rate and rhythm, no murmurs  Examination of peripheral vascular system by observation and palpation is normal  EYES:  Ophthalmoscopic exam of optic discs and posterior segments is normal; no papilledema or hemorrhages  MUSCULOSKELETAL:  Gait, strength, tone, movements noted in Neurologic exam below  NEUROLOGIC: MENTAL STATUS:  No flowsheet data found.  awake, alert, oriented to person, place and time  recent and remote memory intact  normal attention and concentration  language fluent, comprehension intact, naming intact,   fund of knowledge appropriate  CRANIAL NERVE:   2nd - no papilledema on fundoscopic exam  2nd, 3rd, 4th, 6th - pupils equal and reactive to light, visual fields full to confrontation, extraocular muscles intact, no nystagmus  5th - facial sensation symmetric  7th - facial strength symmetric  8th - hearing intact  9th - palate elevates symmetrically, uvula midline  11th - shoulder shrug symmetric  12th - tongue protrusion midline  MOTOR:   normal bulk and tone, full strength in the BUE, BLE  SENSORY:   normal and symmetric to light touch, temperature, vibration  COORDINATION:   finger-nose-finger, fine finger movements normal  REFLEXES:   deep tendon reflexes present and symmetric  GAIT/STATION:   narrow based gait; able to walk tandem; romberg is negative    DIAGNOSTIC DATA (LABS, IMAGING, TESTING) - I reviewed patient records, labs, notes, testing  and imaging myself where available.  Lab Results  Component Value Date   WBC 10.4 03/31/2015   HGB 15.4* 03/31/2015   HCT 44.4 03/31/2015   MCV 103.8* 03/31/2015   PLT 262.0 03/31/2015      Component Value Date/Time   NA 143 03/31/2015 1216   K 4.6 03/31/2015 1216   CL 105 03/31/2015 1216   CO2 29 03/31/2015 1216   GLUCOSE 90 03/31/2015 1216   BUN 10 03/31/2015 1216   CREATININE 1.14 03/31/2015 1216   CALCIUM 10.7* 03/31/2015 1216   PROT 7.6 03/31/2015 1216   ALBUMIN 4.7 03/31/2015 1216   AST 15 03/31/2015 1216   ALT 9 03/31/2015 1216   ALKPHOS 67 03/31/2015 1216   BILITOT 0.8 03/31/2015 1216   GFRNONAA 81* 03/14/2013 1529   GFRAA >90 03/14/2013 1529  Lab Results  Component Value Date   CHOL 262* 01/13/2010   HDL 63.80 01/13/2010   LDLDIRECT 151.6 01/13/2010   TRIG 258.0* 01/13/2010   CHOLHDL 4 01/13/2010   No results found for: HGBA1C Lab Results  Component Value Date   VITAMINB12 >1500 pg/mL* 01/13/2010   Lab Results  Component Value Date   TSH 2.07 03/31/2015   03/15/13 EKG [I reviewed images myself and agree with interpretation. -VRP]  - sinus tachycardia  06/03/06 MRI brain [I reviewed images myself and agree with interpretation. -VRP]  - There is evidence of thrombosis of the right transverse and sigmoid sinus. There also is some superficial cortical vein thrombosis bilaterally. No acute hemorrhage or infarction is identified.  04/10/15 MRI brain [I reviewed images myself and agree with interpretation. -VRP]  1. Progressive moderate generalized atrophy and periventricular white matter disease is advanced for age. Given the degree of atrophy, this is most concerning for chronic microvascular ischemic type changes. Vasculitis is also considered. 2. No acute intracranial abnormality. 3. No acute or focal abnormality to explain seizures.    ASSESSMENT AND PLAN  55 y.o. 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 MRI brain and neurologic examination recently are unremarkable. Will check EEG for further evaluation. No antiseizure medications for now. Seizure precautions reviewed with patient. Advised her to reduce her alcohol use now with a 1 glass of wine per day. Patient also needs assistance with psychiatry/psychology evaluation for mood disturbance. Unfortunately patient is limited financially (no insurance, limited financial means) which will make treatment options challenging. Advised her to seek assistance with Child psychotherapist and apply for health insurance.   Dx:  1. Convulsions, unspecified convulsion type (HCC)   2. Acute post-traumatic headache, not intractable   3. Generalized anxiety disorder      PLAN: - I will check EEG and labs - I will refer you to psychiatry/psychology - use ibuprofen as needed for headache - continue aspirin  daily - reduce alcohol use (no more than 1 glass wine per day) - According to [place] law, you can not drive unless you are seizure free for at least 6 months and under physician's care.  - Please maintain seizure precautions. Do not participate in activities where a loss of awareness could harm you or someone else. No swimming alone, no tub bathing, no hot tubs, no driving, no operating motorized vehicles (cars, ATVs, motocycles, etc), lawnmowers or power tools. No standing at heights, such as rooftops, ladders or stairs. Avoid hot objects such as stoves, heaters, open fires. Wear a helmet when riding a bicycle, scooter, skateboard, etc. and avoid areas of traffic. Set your water heater to 120 degrees or less. - patient limited financially, so treatment options will be challenging   Orders Placed This Encounter  Procedures  . Vitamin B12  . Homocysteine  . Methylmalonic acid, serum  . Ambulatory referral to Psychiatry  . EEG adult   Return in about 6 weeks (around  05/26/2015).  I reviewed images, labs, notes, records myself. I summarized findings and reviewed with patient, for this high risk condition (new onset seizure) requiring high complexity decision making.    Suanne Marker, MD 04/14/2015, 10:48 AM Certified in Neurology, Neurophysiology and Neuroimaging  [facility] [address] [place], [place] [address] [phone]

## 2015-04-14 NOTE — Patient Instructions (Addendum)
Thank you for coming to see [place] at [facility]. I hope we have been able to provide you high quality care today.  You may receive a patient satisfaction survey over the next few weeks. We would appreciate your feedback and comments so that we may continue to improve ourselves and the health of our patients.  - I will check EEG and labs - I will refer you to psychiatry/psychology - use ibuprofen as needed for headache - continue aspirin 328m daily - reduce alcohol use (no more than 1 glass wine per day)  - According to [place] law, you can not drive unless you are seizure free for at least 6 months and under physician's care.   - Please maintain seizure precautions. Do not participate in activities where a loss of awareness could harm you or someone else. No swimming alone, no tub bathing, no hot tubs, no driving, no operating motorized vehicles (cars, ATVs, motocycles, etc), lawnmowers or power tools. No standing at heights, such as rooftops, ladders or stairs. Avoid hot objects such as stoves, heaters, open fires. Wear a helmet when riding a bicycle, scooter, skateboard, etc. and avoid areas of traffic. Set your water heater to 120 degrees or less.    ~~~~~~~~~~~~~~~~~~~~~~~~~~~~~~~~~~~~~~~~~~~~~~~~~~~~~~~~~~~~~~~~~  DR. Lavell Supple'S GUIDE TO HAPPY AND HEALTHY LIVING These are some of my general health and wellness recommendations. Some of them may apply to you better than others. Please use common sense as you try these suggestions and feel free to ask me any questions.   ACTIVITY/FITNESS Mental, social, emotional and physical stimulation are very important for brain and body health. Try learning a new activity (arts, music, language, sports, games).  Keep moving your body to the best of your abilities. You can do this at home, inside or outside, the park, community center, gym or anywhere you like. Consider a physical therapist or personal trainer to get started. Consider the  app Sworkit. Fitness trackers such as smart-watches, smart-phones or Fitbits can help as well.   NUTRITION Eat more plants: colorful vegetables, nuts, seeds and berries.  Eat less sugar, salt, preservatives and processed foods.  Avoid toxins such as cigarettes and alcohol.  Drink water when you are thirsty. Warm water with a slice of lemon is an excellent morning drink to start the day.  Consider these websites for more information The Nutrition Source (hhttps://www.henry-hernandez.biz/ [employer] Nutrition (wWindowBlog.ch   RELAXATION Consider practicing mindfulness meditation or other relaxation techniques such as deep breathing, prayer, yoga, tai chi, massage. See website mindful.org or the apps Headspace or Calm to help get started.   SLEEP Try to get at least 7-8+ hours sleep per day. Regular exercise and reduced caffeine will help you sleep better. Practice good sleep hygeine techniques. See website sleep.org for more information.   PLANNING Prepare estate planning, living will, healthcare POA documents. Sometimes this is best planned with the help of an attorney. Theconversationproject.org and agingwithdignity.org are excellent resources.

## 2015-04-16 ENCOUNTER — Telehealth: Payer: Self-pay | Admitting: Diagnostic Neuroimaging

## 2015-04-16 NOTE — Telephone Encounter (Signed)
I have tried to contact the pt multiple times to schedule EEG with no luck.  Left multiple VM's.  Removed order from list.

## 2015-08-09 ENCOUNTER — Emergency Department (HOSPITAL_COMMUNITY)
Admission: EM | Admit: 2015-08-09 | Discharge: 2015-08-09 | Disposition: A | Discharge status: Home / Self Care | Payer: Self-pay | Attending: Emergency Medicine | Admitting: Emergency Medicine

## 2015-08-09 ENCOUNTER — Encounter (HOSPITAL_COMMUNITY): Payer: Self-pay | Admitting: *Deleted

## 2015-08-09 DIAGNOSIS — Z8669 Personal history of other diseases of the nervous system and sense organs: Secondary | ICD-10-CM | POA: Insufficient documentation

## 2015-08-09 DIAGNOSIS — L0291 Cutaneous abscess, unspecified: Secondary | ICD-10-CM

## 2015-08-09 DIAGNOSIS — Z79899 Other long term (current) drug therapy: Secondary | ICD-10-CM | POA: Insufficient documentation

## 2015-08-09 DIAGNOSIS — R000 Tachycardia, unspecified: Secondary | ICD-10-CM | POA: Insufficient documentation

## 2015-08-09 DIAGNOSIS — F17210 Nicotine dependence, cigarettes, uncomplicated: Secondary | ICD-10-CM | POA: Insufficient documentation

## 2015-08-09 DIAGNOSIS — L02415 Cutaneous abscess of right lower limb: Secondary | ICD-10-CM | POA: Insufficient documentation

## 2015-08-09 DIAGNOSIS — F319 Bipolar disorder, unspecified: Secondary | ICD-10-CM | POA: Insufficient documentation

## 2015-08-09 DIAGNOSIS — Z7982 Long term (current) use of aspirin: Secondary | ICD-10-CM | POA: Insufficient documentation

## 2015-08-09 MED ORDER — LIDOCAINE-EPINEPHRINE 2 %-1:100000 IJ SOLN
20.0000 mL | Freq: Once | INTRAMUSCULAR | Status: AC
  Administered 2015-08-09: 20 mL
  Filled 2015-08-09: qty 1

## 2015-08-09 MED ORDER — HYDROCODONE-ACETAMINOPHEN 5-325 MG PO TABS: 1.0000 | ORAL_TABLET | ORAL | Status: DC | PRN

## 2015-08-09 MED ORDER — HYDROMORPHONE HCL 2 MG/ML IJ SOLN
2.0000 mg | Freq: Once | INTRAMUSCULAR | Status: AC
Start: 2015-08-09 — End: 2015-08-09
  Administered 2015-08-09: 2 mg via INTRAMUSCULAR
  Filled 2015-08-09: qty 1

## 2015-08-09 MED ORDER — SULFAMETHOXAZOLE-TRIMETHOPRIM 800-160 MG PO TABS: 1.0000 | ORAL_TABLET | Freq: Two times a day (BID) | ORAL | Status: DC

## 2015-08-09 MED ORDER — ONDANSETRON 4 MG PO TBDP
4.0000 mg | ORAL_TABLET | Freq: Once | ORAL | Status: AC
  Administered 2015-08-09: 4 mg via ORAL
  Filled 2015-08-09: qty 1

## 2015-08-09 NOTE — Discharge Instructions (Signed)
Soak or warm compress to the area for 20min 2 times a day.  Pull out packing on Wednesday Abscess An abscess is an infected area that contains a collection of pus and debris.It can occur in almost any part of the body. An abscess is also known as a furuncle or boil. CAUSES  An abscess occurs when tissue gets infected. This can occur from blockage of oil or sweat glands, infection of hair follicles, or a minor injury to the skin. As the body tries to fight the infection, pus collects in the area and creates pressure under the skin. This pressure causes pain. People with weakened immune systems have difficulty fighting infections and get certain abscesses more often.  SYMPTOMS Usually an abscess develops on the skin and becomes a painful mass that is red, warm, and tender. If the abscess forms under the skin, you may feel a moveable soft area under the skin. Some abscesses break open (rupture) on their own, but most will continue to get worse without care. The infection can spread deeper into the body and eventually into the bloodstream, causing you to feel ill.  DIAGNOSIS  Your caregiver will take your medical history and perform a physical exam. A sample of fluid may also be taken from the abscess to determine what is causing your infection. TREATMENT  Your caregiver may prescribe antibiotic medicines to fight the infection. However, taking antibiotics alone usually does not cure an abscess. Your caregiver may need to make a small cut (incision) in the abscess to drain the pus. In some cases, gauze is packed into the abscess to reduce pain and to continue draining the area. HOME CARE INSTRUCTIONS   Only take over-the-counter or prescription medicines for pain, discomfort, or fever as directed by your caregiver.  If you were prescribed antibiotics, take them as directed. Finish them even if you start to feel better.  If gauze is used, follow your caregiver's directions for changing the gauze.  To  avoid spreading the infection:  Keep your draining abscess covered with a bandage.  Wash your hands well.  Do not share personal care items, towels, or whirlpools with others.  Avoid skin contact with others.  Keep your skin and clothes clean around the abscess.  Keep all follow-up appointments as directed by your caregiver. SEEK MEDICAL CARE IF:   You have increased pain, swelling, redness, fluid drainage, or bleeding.  You have muscle aches, chills, or a general ill feeling.  You have a fever. MAKE SURE YOU:   Understand these instructions.  Will watch your condition.  Will get help right away if you are not doing well or get worse.   This information is not intended to replace advice given to you by your health care provider. Make sure you discuss any questions you have with your health care provider.   Document Released: 11/03/2004 Document Revised: 07/26/2011 Document Reviewed: 04/08/2011 Elsevier Interactive Patient Education 2016 Elsevier Inc.  Incision and Drainage Incision and drainage is a procedure in which a sac-like structure (cystic structure) is opened and drained. The area to be drained usually contains material such as pus, fluid, or blood.  LET YOUR CAREGIVER KNOW ABOUT:   Allergies to medicine.  Medicines taken, including vitamins, herbs, eyedrops, over-the-counter medicines, and creams.  Use of steroids (by mouth or creams).  Previous problems with anesthetics or numbing medicines.  History of bleeding problems or blood clots.  Previous surgery.  Other health problems, including diabetes and kidney problems.  Possibility of  pregnancy, if this applies. RISKS AND COMPLICATIONS  Pain.  Bleeding.  Scarring.  Infection. BEFORE THE PROCEDURE  You may need to have an ultrasound or other imaging tests to see how large or deep your cystic structure is. Blood tests may also be used to determine if you have an infection or how severe the  infection is. You may need to have a tetanus shot. PROCEDURE  The affected area is cleaned with a cleaning fluid. The cyst area will then be numbed with a medicine (local anesthetic). A small incision will be made in the cystic structure. A syringe or catheter may be used to drain the contents of the cystic structure, or the contents may be squeezed out. The area will then be flushed with a cleansing solution. After cleansing the area, it is often gently packed with a gauze or another wound dressing. Once it is packed, it will be covered with gauze and tape or some other type of wound dressing. AFTER THE PROCEDURE   Often, you will be allowed to go home right after the procedure.  You may be given antibiotic medicine to prevent or heal an infection.  If the area was packed with gauze or some other wound dressing, you will likely need to come back in 1 to 2 days to get it removed.  The area should heal in about 14 days.   This information is not intended to replace advice given to you by your health care provider. Make sure you discuss any questions you have with your health care provider.   Document Released: 07/20/2000 Document Revised: 07/26/2011 Document Reviewed: 03/21/2011 Elsevier Interactive Patient Education [employer] Elsevier Inc.

## 2015-08-09 NOTE — ED Notes (Signed)
Patient has a "cyst" on her right hip "for years."  She has seen a dermatologist and PCP about it and over the past week it has become considerably larger and she developed N/V with fever (as high as 102 orally last night).

## 2015-08-09 NOTE — ED Provider Notes (Addendum)
CSN: [id number]     Arrival date & time 08/09/15  1426 History   First MD Initiated Contact with Patient 08/09/15 1436     Chief Complaint  Patient presents with  . Abscess     (Consider location/radiation/quality/duration/timing/severity/associated sxs/prior Treatment) Patient is a 55 y.o. female presenting with abscess. The history is provided by the patient.  Abscess Location:  Leg Leg abscess location:  R hip Size:  4x4cm Abscess quality: fluctuance, induration, painful, redness and warmth   Red streaking: no   Duration:  3 days Progression:  Worsening Pain details:    Quality:  Sharp, shooting and throbbing   Severity:  Severe   Duration:  3 days   Timing:  Constant   Progression:  Worsening Chronicity:  New Context comment:  Pt states she has had a cyst on her hip for years but over the last 3 days it has started to get red and painful Relieved by:  Nothing Worsened by:  Draining/squeezing Ineffective treatments:  Warm compresses, warm water soaks and topical antibiotics Associated symptoms: fever, nausea and vomiting   Associated symptoms comment:  Pt states yesterday she had a temp of 102 with nausea and vomiting but not today Risk factors: no hx of MRSA     Past Medical History  Diagnosis Date  . 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 History of elevated homocysteine   . Bipolar 1 disorder (HCC)   . Anxiety   . Fibromyalgia   . Endometriosis   . Congenital single kidney   . Clotting disorder (HCC)     hx of blood clot  . Cerebral hemorrhage (HCC)   . Hemorrhoids   . Anal fissure   . Depression   . Seizures (HCC) 03/18/15   Past Surgical History  Procedure Laterality Date  . Cesarean section    . Tubal ligation    . Laparoscopy      endometriosis  . Breast surgery      benign breast lump removal  . Leep  2000  . Tonsillectomy    . Appendectomy     Family History  Problem Relation Age of Onset  . Coronary artery  disease Other   . Stroke Other   . Hypertension Other   . Heart disease Mother   . Cancer Sister     breast  . Heart disease Brother    Social History  Substance Use Topics  . Smoking status: Current Every Day Smoker -- 1.00 packs/day    Types: Cigarettes  . Smokeless tobacco: Never Used  . Alcohol Use: 0.0 oz/week    0 Standard drinks or equivalent per week     Comment: occ   OB History    No data available     Review of Systems  Constitutional: Positive for fever.  Gastrointestinal: Positive for nausea and vomiting.  All other systems reviewed and are negative.     Allergies  Acetaminophen and Compazine  Home Medications   Prior to Admission medications   Medication Sig Start Date End Date Taking? Authorizing Provider  aspirin 325 MG tablet Take 325 mg by mouth every 6 (six) hours as needed for mild pain or headache.     Historical Provider, MD  LORazepam (ATIVAN) 1 MG tablet TAKE 1 TABLET BY MOUTH EVERY 6 HOURS AS NEEDED FOR ANXIETY 02/11/15   Nelwyn SalisburyStephen A Fry, MD  vitamin B-12 (CYANOCOBALAMIN) 1000 MCG tablet Take 1,000 mcg by mouth daily.  Historical Provider, MD   BP 145/100 mmHg  Pulse 126  Temp(Src) 97.7 F (36.5 C) (Oral)  Resp 20  Ht 5\' 1"  (1.549 m)  Wt 99 lb (44.906 kg)  BMI 18.72 kg/m2  SpO2 100% Physical Exam  Constitutional: She is oriented to person, place, and time. She appears well-developed and well-nourished. She appears distressed.  Anxious and in pain  HENT:  Head: Normocephalic and atraumatic.  Mouth/Throat: Oropharynx is clear and moist.  Eyes: EOM are normal. Pupils are equal, round, and reactive to light. Right conjunctiva is injected. Left conjunctiva is injected.  Neck: Normal range of motion. Neck supple.  Cardiovascular: Regular rhythm and intact distal pulses.  Tachycardia present.   No murmur heard. Pulmonary/Chest: Effort normal and breath sounds normal. No respiratory distress. She has no wheezes. She has no rales.  Abdominal:  Soft. She exhibits no distension. There is no tenderness. There is no rebound and no guarding.  Musculoskeletal: Normal range of motion. She exhibits tenderness. She exhibits no edema.       Legs: Neurological: She is alert and oriented to person, place, and time.  Skin: Skin is warm and dry. No rash noted. No erythema.  Psychiatric: Her behavior is normal. Her mood appears anxious. Her speech is rapid and/or pressured.  Nursing note and vitals reviewed.   ED Course  Procedures (including critical care time) Labs Review Labs Reviewed - No data to display  Imaging Review No results found. I have personally reviewed and evaluated these images and lab results as part of my medical decision-making.  INCISION AND DRAINAGE Performed by: Gwyneth SproutPLUNKETT,Aayat Hajjar Consent: Verbal consent obtained. Risks and benefits: risks, benefits and alternatives were discussed Type: abscess  Body area: right hip  Anesthesia: local infiltration  Incision was made with a scalpel.  Local anesthetic: lidocaine 2% with epinephrine  Anesthetic total: 4 ml  Complexity: complex Blunt dissection to break up loculations  Drainage: purulent  Drainage amount: 10mL  Packing material: 1/4 in iodoform gauze  Patient tolerance: Patient tolerated the procedure well with no immediate complications.     MDM   Final diagnoses:  Abscess    Patient presenting with evidence of an abscess versus infected cyst on her right lateral hip. Surrounding erythema present with significant pain. Patient states she did have a fever last night but is currently afebrile. She denies any vomiting today.  No rashes, abdominal pain or chest pain. Patient initially was tachycardic here however she is extremely anxious and upset which is most likely the cause of her tachycardia. Low suspicion for septic joint at this time and feel that this is superficial. Patient has normal function of her right leg and pulses are intact. Patient  given pain control and I&D as above.    Gwyneth SproutWhitney Ulus Hazen, MD 08/09/15 1557  Gwyneth SproutWhitney Talyn Dessert, MD 08/09/15 1601

## 2015-08-10 ENCOUNTER — Telehealth: Payer: Self-pay | Admitting: Family Medicine

## 2015-08-10 NOTE — Telephone Encounter (Signed)
Pt scheduled  

## 2015-08-10 NOTE — Telephone Encounter (Signed)
Okay to schedule

## 2015-08-10 NOTE — Telephone Encounter (Signed)
Pt had a cyst on her side and went to the ER they lanced, drained and packed it.  Pt state that it needs to be removed on Wednesday and no later per hospital.  Can we work her in on Wednesday?

## 2015-08-12 ENCOUNTER — Encounter: Payer: Self-pay | Admitting: Family Medicine

## 2015-08-12 ENCOUNTER — Ambulatory Visit (INDEPENDENT_AMBULATORY_CARE_PROVIDER_SITE_OTHER): Payer: Self-pay | Admitting: Family Medicine

## 2015-08-12 VITALS — BP 100/62 | Temp 98.6°F | Ht 61.0 in | Wt 95.0 lb

## 2015-08-12 DIAGNOSIS — L02415 Cutaneous abscess of right lower limb: Secondary | ICD-10-CM

## 2015-08-12 MED ORDER — HYDROCODONE-ACETAMINOPHEN 5-325 MG PO TABS: 1.0000 | ORAL_TABLET | ORAL | Status: DC | PRN

## 2015-08-12 MED ORDER — SULFAMETHOXAZOLE-TRIMETHOPRIM 800-160 MG PO TABS: 1.0000 | ORAL_TABLET | Freq: Two times a day (BID) | ORAL | Status: AC

## 2015-08-12 NOTE — Progress Notes (Signed)
Pre visit review using our clinic review tool, if applicable. No additional management support is needed unless otherwise documented below in the visit note. 

## 2015-08-13 ENCOUNTER — Encounter: Payer: Self-pay | Admitting: Family Medicine

## 2015-08-13 NOTE — Progress Notes (Signed)
   Subjective:    Patient ID: [name], female    DOB: March 07, 1960, 55 y.o.   MRN: [id number]  HPI Here to follow up an ER visit on 08-09-15 when she came in for a large painful abscess on the right hip. This was lanced and drained, and it was then packed with gauze. No culture was obtained. Se was given a 7 day course of Bactrim DS. She feels much better now. The pain has decreased and the swelling and redness has greatly decreased. No fever.   Review of Systems  Constitutional: Negative.   Skin: Positive for wound.       Objective:   Physical Exam  Constitutional: She appears well-developed and well-nourished.  Skin:  The right lateral hip has a tender cystic area with an opening and gauze trailing out. This is surrounded by erythema and warmth.           Assessment & Plan:  Abscess which is healing well. The packing was removed today. We will give her an additional supply of Bactrim DS to allow her to take a full 28 day course. Recheck prn.  Nelwyn SalisburyFRY,Christi Wirick A, MD

## 2015-11-01 ENCOUNTER — Other Ambulatory Visit: Payer: Self-pay | Admitting: Family Medicine

## 2015-11-02 NOTE — Telephone Encounter (Signed)
Call in #120 with 5 rf 

## 2016-08-16 ENCOUNTER — Other Ambulatory Visit: Payer: Self-pay | Admitting: Family Medicine

## 2016-08-16 NOTE — Telephone Encounter (Signed)
Call in #[phone] with no rf. She needs an OV soon 

## 2017-03-24 ENCOUNTER — Telehealth: Payer: Self-pay | Admitting: Family Medicine

## 2017-03-24 NOTE — Telephone Encounter (Signed)
Copied from CRM [id number]. Topic: Quick Communication - Rx Refill/Question >> Mar 24, 2017  8:18 AM Maia Pettiesrtiz, Kristie S wrote: Medication: LORazepam (ATIVAN) 1 MG tablet - 6 tablets left  Has the patient contacted their pharmacy? Yes.  Pharmacy advised pt to call provider Preferred Pharmacy (with phone number or street name): [facility] [phone] [place] - [phone] [facility] [address] [facility] [address] [phone] (Phone) [phone] (Fax)  Agent: Please be advised that RX refills may take up to 3 business days. We ask that you follow-up with your pharmacy.

## 2017-03-27 NOTE — Telephone Encounter (Signed)
Spoke with pt and advised her that since we have not seen her in the office in 18months she will have to make an OV and be seen by Dr. Clent RidgesFry in order to continue this medication. Pt voiced understanding. She states that she has to have some major dental work done and will call after this is taken care of.

## 2017-03-27 NOTE — Telephone Encounter (Signed)
Pt was called and a detailed message was left stating that she has not been seen since 2017. A OV is needed to refill this medication.

## 2018-03-26 ENCOUNTER — Emergency Department (HOSPITAL_COMMUNITY)
Admission: EM | Admit: 2018-03-26 | Discharge: 2018-03-26 | Disposition: A | Discharge status: Home / Self Care | Payer: Self-pay | Attending: Emergency Medicine | Admitting: Emergency Medicine

## 2018-03-26 ENCOUNTER — Emergency Department (HOSPITAL_COMMUNITY): Payer: Self-pay

## 2018-03-26 ENCOUNTER — Other Ambulatory Visit: Payer: Self-pay

## 2018-03-26 ENCOUNTER — Encounter (HOSPITAL_COMMUNITY): Payer: Self-pay | Admitting: Emergency Medicine

## 2018-03-26 DIAGNOSIS — Y998 Other external cause status: Secondary | ICD-10-CM | POA: Insufficient documentation

## 2018-03-26 DIAGNOSIS — Y939 Activity, unspecified: Secondary | ICD-10-CM | POA: Insufficient documentation

## 2018-03-26 DIAGNOSIS — Z79899 Other long term (current) drug therapy: Secondary | ICD-10-CM | POA: Insufficient documentation

## 2018-03-26 DIAGNOSIS — S42215A Unspecified nondisplaced fracture of surgical neck of left humerus, initial encounter for closed fracture: Secondary | ICD-10-CM | POA: Insufficient documentation

## 2018-03-26 DIAGNOSIS — S8265XA Nondisplaced fracture of lateral malleolus of left fibula, initial encounter for closed fracture: Secondary | ICD-10-CM | POA: Insufficient documentation

## 2018-03-26 DIAGNOSIS — W010XXA Fall on same level from slipping, tripping and stumbling without subsequent striking against object, initial encounter: Secondary | ICD-10-CM | POA: Insufficient documentation

## 2018-03-26 DIAGNOSIS — F17210 Nicotine dependence, cigarettes, uncomplicated: Secondary | ICD-10-CM | POA: Insufficient documentation

## 2018-03-26 DIAGNOSIS — Y92009 Unspecified place in unspecified non-institutional (private) residence as the place of occurrence of the external cause: Secondary | ICD-10-CM | POA: Insufficient documentation

## 2018-03-26 MED ORDER — FENTANYL CITRATE (PF) 100 MCG/2ML IJ SOLN
25.0000 ug | Freq: Once | INTRAMUSCULAR | Status: AC
  Administered 2018-03-26: 25 ug via INTRAVENOUS
  Filled 2018-03-26: qty 2

## 2018-03-26 MED ORDER — IBUPROFEN 600 MG PO TABS: 600.0000 mg | ORAL_TABLET | Freq: Four times a day (QID) | ORAL | 0 refills | Status: DC | PRN

## 2018-03-26 MED ORDER — OXYCODONE HCL 5 MG PO TABS
5.0000 mg | ORAL_TABLET | Freq: Once | ORAL | Status: AC
  Administered 2018-03-26: 5 mg via ORAL
  Filled 2018-03-26: qty 1

## 2018-03-26 MED ORDER — OXYCODONE HCL 5 MG PO TABS: 5.0000 mg | ORAL_TABLET | Freq: Four times a day (QID) | ORAL | 0 refills | Status: DC | PRN

## 2018-03-26 NOTE — ED Notes (Signed)
Ortho at bedside.

## 2018-03-26 NOTE — ED Provider Notes (Signed)
[facility] EMERGENCY DEPARTMENT Provider Note   CSN: [id number] Arrival date & time:        History   Chief Complaint Chief Complaint  Patient presents with  . Fall  . Shoulder Pain    HPI Chantrell Motamedi Bialy is a 58 y.o. female with past medical history of anxiety, bipolar 1 disorder, seizure disorder, congenital single kidney, presenting to the emergency department with complaint of left arm pain and left ankle pain after mechanical fall that occurred late Friday night.  Patient states she got up to use the restroom, and was half asleep, tripping and falling.  She has had significant pain to her left arm and ankle since that time.  Treating at home with ibuprofen with minimal relief.  Pain to left arm is worse with any palpation or movement.  She has associated swelling and bruising.  Left ankle also has associated swelling and bruising, worse with weightbearing.  Reports remote history of fracture to left ankle.  She denies pain to her elbow or wrist in the left arm.  No numbness or tingling to the left hand.  No head trauma or LOC.  Not on anticoagulation.  No new neck or back pain.  The history is provided by the patient.    Past Medical History:  Diagnosis Date  . Anal fissure   . Anxiety   . Bipolar 1 disorder (HCC)   . Cerebral hemorrhage (HCC)   . Clotting disorder (HCC)    hx of blood clot  . Congenital single kidney   . Depression   . Endometriosis   . Fibromyalgia   . Hemorrhoids   . History of elevated homocysteine   . Seizures (HCC) 03/18/15  . 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 Anal fissure 03/02/2011  . LACERATION, SCALP 08/25/2008  . Anxiety state 05/23/2008  . EPIGASTRIC PAIN 05/23/2008  . DEEP VENOUS THROMBOPHLEBITIS, HX OF 11/14/2006  . EMBOLISM/THROMBOSIS, DEEP VSL PRXML LWR EXTRM 08/28/2006    Past Surgical History:  Procedure Laterality  Date  . APPENDECTOMY    . BREAST SURGERY     benign breast lump removal  . CESAREAN SECTION    . LAPAROSCOPY     endometriosis  . LEEP  2000  . TONSILLECTOMY    . TUBAL LIGATION       OB History   No obstetric history on file.      Home Medications    Prior to Admission medications   Medication Sig Start Date End Date Taking? Authorizing Provider  aspirin 325 MG tablet Take 325 mg by mouth every 6 (six) hours as needed for mild pain or headache.     [provider]  HYDROcodone-acetaminophen (NORCO/VICODIN) 5-325 MG tablet Take 1-2 tablets by mouth every 4 (four) hours as needed for severe pain. 08/12/15   Nelwyn Salisbury, MD  ibuprofen (ADVIL,MOTRIN) 600 MG tablet Take 1 tablet (600 mg total) by mouth every 6 (six) hours as needed. 03/26/18   Ginelle Bays, [place] N, PA-C  LORazepam (ATIVAN) 1 MG tablet TAKE 1 TABLET BY MOUTH EVERY 6 HOURS AS NEEDED 08/17/16   Nelwyn Salisbury, MD  Multiple Vitamin (MULTIVITAMIN WITH MINERALS) TABS tablet Take 1 tablet by mouth daily.    [provider]  oxyCODONE (ROXICODONE) 5 MG immediate release tablet Take 1 tablet (5 mg total) by mouth every 6 (six) hours as  needed for severe pain. 03/26/18   Zhara Gieske, [place] N, PA-C  vitamin B-12 (CYANOCOBALAMIN) 1000 MCG tablet Take 1,000 mcg by mouth daily.    [provider]    Family History Family History  Problem Relation Age of Onset  . Heart disease Mother   . Cancer Sister        breast  . Heart disease Brother   . Coronary artery disease Other   . Stroke Other   . Hypertension Other     Social History Social History   Tobacco Use  . Smoking status: Current Every Day Smoker    Packs/day: 1.00    Types: Cigarettes  . Smokeless tobacco: Never Used  Substance Use Topics  . Alcohol use: Yes    Alcohol/week: 0.0 standard drinks    Comment: frequently   . Drug use: No     Allergies   Acetaminophen and Compazine   Review of Systems Review of Systems    Musculoskeletal: Positive for arthralgias and joint swelling. Negative for back pain and neck pain.  Skin: Positive for color change. Negative for wound.  Neurological: Negative for headaches.  Hematological: Does not bruise/bleed easily.  Psychiatric/Behavioral: Negative for confusion.  All other systems reviewed and are negative.    Physical Exam Updated Vital Signs BP 136/82 (BP Location: Right Arm)   Pulse 87   Temp 98.4 F (36.9 C) (Oral)   Resp 18   Ht 5\' 1"  (1.549 m)   Wt 46.7 kg   SpO2 97%   BMI 19.46 kg/m   Physical Exam Vitals signs and nursing note reviewed.  Constitutional:      General: She is not in acute distress.    Appearance: She is well-developed.     Comments: Poor hygiene  HENT:     Head: Normocephalic and atraumatic.  Eyes:     Conjunctiva/sclera: Conjunctivae normal.  Neck:     Musculoskeletal: Normal range of motion and neck supple. No muscular tenderness.  Cardiovascular:     Rate and Rhythm: Regular rhythm. Tachycardia present.  Pulmonary:     Effort: Pulmonary effort is normal.     Breath sounds: Normal breath sounds.  Abdominal:     Palpations: Abdomen is soft.  Musculoskeletal:     Comments: Left upper arm with significant bruising and associated swelling.  There is generalized tenderness to the upper arm, mid/mid proximal shaft.  Unable to range shoulder secondary to pain in the arm.  Patient is able to pronate and supinate the wrist without pain to the elbow or wrist.  Strong grip strength.  Normal sensation. Left ankle with bruising and swelling to the lateral aspect and associated tenderness.  Patient able to actively range ankle.  Normal sensation. No midline spinal or paraspinal tenderness, no bony step-offs or gross deformities.  Skin:    General: Skin is warm.  Neurological:     Mental Status: She is alert.  Psychiatric:        Mood and Affect: Mood normal.        Behavior: Behavior normal.      ED Treatments / Results   Labs (all labs ordered are listed, but only abnormal results are displayed) Labs Reviewed - No data to display  EKG None  Radiology Dg Ankle Complete Left  Result Date: 03/26/2018 CLINICAL DATA:  Fall today.  Ankle pain. EXAM: LEFT ANKLE COMPLETE - 3+ VIEW COMPARISON:  None. FINDINGS: The bones are demineralized. There is subtle cortical irregularity of the distal fibula laterally  on the oblique view which could reflect a nondisplaced fracture. There is no widening of the ankle mortise. The distal tibia and visualized tarsal bones appear intact. There is moderate lateral soft tissue swelling. IMPRESSION: Possible nondisplaced fracture of the distal fibula with associated soft tissue swelling. Electronically Signed   By: Carey Bullocks M.D.   On: 03/26/2018 13:46   Dg Shoulder Left  Result Date: 03/26/2018 CLINICAL DATA:  Larey Seat.  Left shoulder pain. EXAM: LEFT SHOULDER - 2+ VIEW COMPARISON:  None. FINDINGS: Acute impacted humeral neck fracture. Some rotation of the humeral head relative to the glenoid. No other acute regional fracture. Old healed left fourth and fifth rib fractures. IMPRESSION: Impacted humeral neck fracture. Electronically Signed   By: Paulina Fusi M.D.   On: 03/26/2018 13:47   Dg Humerus Left  Result Date: 03/26/2018 CLINICAL DATA:  Larey Seat 2 days ago.  Left shoulder region pain. EXAM: LEFT HUMERUS - 2+ VIEW COMPARISON:  None. FINDINGS: Acute impacted humeral neck fracture. Rotation of the humeral head relative to the glenoid. Apparent old fractures of the left lateral for than fifth ribs. IMPRESSION: Impacted humeral neck fracture. Electronically Signed   By: Paulina Fusi M.D.   On: 03/26/2018 13:46    Procedures Procedures (including critical care time)  Medications Ordered in ED Medications  fentaNYL (SUBLIMAZE) injection 25 mcg (25 mcg Intravenous Given 03/26/18 1352)  oxyCODONE (Oxy IR/ROXICODONE) immediate release tablet 5 mg (5 mg Oral Given 03/26/18 1447)      Initial Impression / Assessment and Plan / ED Course  I have reviewed the triage vital signs and the nursing notes.  Pertinent labs & imaging results that were available during my care of the patient were reviewed by me and considered in my medical decision making (see chart for details).     Patient presenting with left arm pain and left ankle pain after mechanical fall that occurred on late Friday night.  No head trauma or LOC.  Significant ecchymosis to the left upper arm as well has associated swelling.  No obvious deformity.  Left ankle with swelling, ecchymosis and tenderness to the lateral aspect as well, however with normal range of motion.  N/V intact.  X-ray with impacted humeral neck fracture and possible nondisplaced distal left fibular fracture.  Sling placed for immobilization.  Cam walker for ankle.  Orthopedic referral provided for follow-up.  Will give short course of pain medication.  R ICE therapy discussed.  [place] Controlled Substance reporting System queried  Discussed results, findings, treatment and follow up. Patient advised of return precautions. Patient verbalized understanding and agreed with plan.  Final Clinical Impressions(s) / ED Diagnoses   Final diagnoses:  Closed nondisplaced fracture of surgical neck of left humerus, unspecified fracture morphology, initial encounter  Closed nondisplaced fracture of lateral malleolus of left fibula, initial encounter    ED Discharge Orders         Ordered    oxyCODONE (ROXICODONE) 5 MG immediate release tablet  Every 6 hours PRN     03/26/18 1420    ibuprofen (ADVIL,MOTRIN) 600 MG tablet  Every 6 hours PRN     03/26/18 1420           David Rodriquez, [place], [place] 03/26/18 1457    Maia Plan, MD 03/27/18 [phone]

## 2018-03-26 NOTE — ED Triage Notes (Signed)
Patient presents to the ED by EMS with c/o fall with shoulder pain on Friday, she reports falling into the door.  Dark bruising noted to the left upper arm, reports chronic pains. Taking lorazepam for anxiety.

## 2018-03-26 NOTE — Progress Notes (Signed)
Orthopedic Tech Progress Note Patient Details:  [name] 1960/04/09 [id number]   Ortho Devices Type of Ortho Device: CAM walker, Sling immobilizer Ortho Device/Splint Interventions: Ordered, Application, Adjustment   Post Interventions Patient Tolerated: Well Instructions Provided: Adjustment of device, Care of device   [name] [name] 03/26/2018, 3:16 PM

## 2018-03-26 NOTE — ED Notes (Signed)
Ortho paged. 

## 2018-03-26 NOTE — ED Notes (Signed)
ED Provider at bedside. 

## 2018-03-26 NOTE — Discharge Instructions (Signed)
Please read instructions below. Apply ice to your arm and ankle for 20 minutes at a time. Wear the sling at all times. Do not hold any weight in your left hand, however keep your hand moving.  Wear the boot when weight bearing. You can take oxycodone every 6 hours as needed for severe pain.  Be aware this medication can make you drowsy, do not drive or drink alcohol while taking this medication. You can take ibuprofen every 6 hours for mild to moderate pain.  It is safe to take this with oxycodone. Schedule an appointment with the orthopedic specialist in 1 week for follow-up on your injuries. Return to the ER for new or concerning symptoms.

## 2018-03-26 NOTE — ED Notes (Signed)
Patient transported to X-ray 

## 2018-03-28 ENCOUNTER — Telehealth: Payer: Self-pay | Admitting: Family Medicine

## 2018-03-28 NOTE — Telephone Encounter (Signed)
Copied from CRM [id number]. Topic: Quick Communication - See Telephone Encounter >> Mar 28, 2018 10:08 AM Trula Slade wrote: CRM for notification. See Telephone encounter for: 03/28/18. Patient fell and fractured her ankle and shoulder.  They gave her oxyCODONE (ROXICODONE) 5 MG immediate release tablet medication in the hospital and set her up for an appt with an Ortho doctor on the 25th of February, but her medication will run out today. Patient is in excruchiating pain. She would like for Dr. Clent Ridges  to write a prescription for the Oxycodone until she can see the Ortho doctor. Please send to [facility] on [address] [phone]. Please advise at [phone].

## 2018-03-29 NOTE — Telephone Encounter (Signed)
She will need to be seen for this, make an OV

## 2018-03-29 NOTE — Telephone Encounter (Signed)
ATC. Unable to leave a voicemail. CRM created.  

## 2018-03-30 NOTE — Telephone Encounter (Signed)
I CANNOT prescribe narcotic medications to someone for a problem I have not evaluated

## 2018-03-30 NOTE — Telephone Encounter (Signed)
Pt stated that she is scheduled to see the surgeon on Tuesday.

## 2018-03-30 NOTE — Telephone Encounter (Signed)
I have called and LM for the pt to call back

## 2018-03-30 NOTE — Telephone Encounter (Signed)
Pt called and stated she is not able to make an OV due to lack of insurance. Please advise.

## 2018-03-30 NOTE — Telephone Encounter (Signed)
Dr. Clent Ridges please advise.   Pt stated that she does not have any insurance and is not able to make an appt.

## 2018-08-12 ENCOUNTER — Other Ambulatory Visit: Payer: Self-pay

## 2018-08-12 ENCOUNTER — Emergency Department (HOSPITAL_COMMUNITY): Payer: Self-pay

## 2018-08-12 ENCOUNTER — Encounter (HOSPITAL_COMMUNITY): Payer: Self-pay | Admitting: Emergency Medicine

## 2018-08-12 ENCOUNTER — Inpatient Hospital Stay (HOSPITAL_COMMUNITY): Payer: Self-pay

## 2018-08-12 ENCOUNTER — Inpatient Hospital Stay (HOSPITAL_COMMUNITY)
Admission: EM | Admit: 2018-08-12 | Discharge: 2018-08-14 | DRG: 536 | Disposition: A | Discharge status: Home / Self Care | Payer: Self-pay | Attending: Family Medicine | Admitting: Family Medicine

## 2018-08-12 DIAGNOSIS — E871 Hypo-osmolality and hyponatremia: Secondary | ICD-10-CM | POA: Diagnosis present

## 2018-08-12 DIAGNOSIS — Z79899 Other long term (current) drug therapy: Secondary | ICD-10-CM

## 2018-08-12 DIAGNOSIS — S32591A Other specified fracture of right pubis, initial encounter for closed fracture: Secondary | ICD-10-CM

## 2018-08-12 DIAGNOSIS — F419 Anxiety disorder, unspecified: Secondary | ICD-10-CM | POA: Diagnosis present

## 2018-08-12 DIAGNOSIS — S32592A Other specified fracture of left pubis, initial encounter for closed fracture: Principal | ICD-10-CM | POA: Diagnosis present

## 2018-08-12 DIAGNOSIS — D689 Coagulation defect, unspecified: Secondary | ICD-10-CM | POA: Diagnosis present

## 2018-08-12 DIAGNOSIS — Z888 Allergy status to other drugs, medicaments and biological substances status: Secondary | ICD-10-CM

## 2018-08-12 DIAGNOSIS — E872 Acidosis: Secondary | ICD-10-CM

## 2018-08-12 DIAGNOSIS — F17210 Nicotine dependence, cigarettes, uncomplicated: Secondary | ICD-10-CM | POA: Diagnosis present

## 2018-08-12 DIAGNOSIS — S32111A Minimally displaced Zone I fracture of sacrum, initial encounter for closed fracture: Secondary | ICD-10-CM | POA: Diagnosis present

## 2018-08-12 DIAGNOSIS — Z915 Personal history of self-harm: Secondary | ICD-10-CM

## 2018-08-12 DIAGNOSIS — Q600 Renal agenesis, unilateral: Secondary | ICD-10-CM

## 2018-08-12 DIAGNOSIS — Z886 Allergy status to analgesic agent status: Secondary | ICD-10-CM

## 2018-08-12 DIAGNOSIS — R269 Unspecified abnormalities of gait and mobility: Secondary | ICD-10-CM

## 2018-08-12 DIAGNOSIS — Z9181 History of falling: Secondary | ICD-10-CM

## 2018-08-12 DIAGNOSIS — Z8249 Family history of ischemic heart disease and other diseases of the circulatory system: Secondary | ICD-10-CM

## 2018-08-12 DIAGNOSIS — W109XXA Fall (on) (from) unspecified stairs and steps, initial encounter: Secondary | ICD-10-CM | POA: Diagnosis present

## 2018-08-12 DIAGNOSIS — Z7982 Long term (current) use of aspirin: Secondary | ICD-10-CM

## 2018-08-12 DIAGNOSIS — Z1159 Encounter for screening for other viral diseases: Secondary | ICD-10-CM

## 2018-08-12 DIAGNOSIS — F10929 Alcohol use, unspecified with intoxication, unspecified: Secondary | ICD-10-CM | POA: Diagnosis present

## 2018-08-12 DIAGNOSIS — E8729 Other acidosis: Secondary | ICD-10-CM | POA: Diagnosis present

## 2018-08-12 DIAGNOSIS — F10229 Alcohol dependence with intoxication, unspecified: Secondary | ICD-10-CM | POA: Diagnosis present

## 2018-08-12 DIAGNOSIS — M797 Fibromyalgia: Secondary | ICD-10-CM | POA: Diagnosis present

## 2018-08-12 DIAGNOSIS — F319 Bipolar disorder, unspecified: Secondary | ICD-10-CM | POA: Diagnosis present

## 2018-08-12 DIAGNOSIS — Y908 Blood alcohol level of 240 mg/100 ml or more: Secondary | ICD-10-CM | POA: Diagnosis present

## 2018-08-12 DIAGNOSIS — W19XXXA Unspecified fall, initial encounter: Secondary | ICD-10-CM

## 2018-08-12 DIAGNOSIS — Z86718 Personal history of other venous thrombosis and embolism: Secondary | ICD-10-CM

## 2018-08-12 LAB — ETHANOL: Alcohol, Ethyl (B): 240 mg/dL — ABNORMAL HIGH (ref <10)

## 2018-08-12 LAB — COMPREHENSIVE METABOLIC PANEL
ALT: 16 U/L (ref 0–44)
AST: 26 U/L (ref 15–41)
Albumin: 3.2 g/dL — ABNORMAL LOW (ref 3.5–5.0)
Alkaline Phosphatase: 106 U/L (ref 38–126)
Anion gap: 16 — ABNORMAL HIGH (ref 5–15)
BUN: 6 mg/dL (ref 6–20)
CO2: 13 mmol/L — ABNORMAL LOW (ref 22–32)
Calcium: 8.5 mg/dL — ABNORMAL LOW (ref 8.9–10.3)
Chloride: 103 mmol/L (ref 98–111)
Creatinine, Ser: 0.95 mg/dL (ref 0.44–1.00)
GFR calc Af Amer: >60 mL/min (ref >60)
GFR calc non Af Amer: >60 mL/min (ref >60)
Glucose, Bld: 94 mg/dL (ref 70–99)
Potassium: 3.9 mmol/L (ref 3.5–5.1)
Sodium: 132 mmol/L — ABNORMAL LOW (ref 135–145)
Total Bilirubin: 0.8 mg/dL (ref 0.3–1.2)
Total Protein: 6.4 g/dL — ABNORMAL LOW (ref 6.5–8.1)

## 2018-08-12 LAB — SARS CORONAVIRUS 2 BY RT PCR (HOSPITAL ORDER, PERFORMED IN ~~LOC~~ HOSPITAL LAB): SARS Coronavirus 2: NEGATIVE

## 2018-08-12 LAB — CBC WITH DIFFERENTIAL/PLATELET
Abs Immature Granulocytes: 0.14 10*3/uL — ABNORMAL HIGH (ref 0.00–0.07)
Basophils Absolute: 0 10*3/uL (ref 0.0–0.1)
Basophils Relative: 0 %
Eosinophils Absolute: 0 10*3/uL (ref 0.0–0.5)
Eosinophils Relative: 0 %
HCT: 42 % (ref 36.0–46.0)
Hemoglobin: 13.9 g/dL (ref 12.0–15.0)
Immature Granulocytes: 1 %
Lymphocytes Relative: 5 %
Lymphs Abs: 0.7 10*3/uL (ref 0.7–4.0)
MCH: 35.7 pg — ABNORMAL HIGH (ref 26.0–34.0)
MCHC: 33.1 g/dL (ref 30.0–36.0)
MCV: 108 fL — ABNORMAL HIGH (ref 80.0–100.0)
Monocytes Absolute: 0.8 10*3/uL (ref 0.1–1.0)
Monocytes Relative: 6 %
Neutro Abs: 11.3 10*3/uL — ABNORMAL HIGH (ref 1.7–7.7)
Neutrophils Relative %: 88 %
Platelets: 240 10*3/uL (ref 150–400)
RBC: 3.89 MIL/uL (ref 3.87–5.11)
RDW: 16 % — ABNORMAL HIGH (ref 11.5–15.5)
WBC: 12.9 10*3/uL — ABNORMAL HIGH (ref 4.0–10.5)
nRBC: 0 % (ref 0.0–0.2)

## 2018-08-12 LAB — PROTIME-INR
INR: 1 (ref 0.8–1.2)
Prothrombin Time: 13.1 seconds (ref 11.4–15.2)

## 2018-08-12 MED ORDER — THIAMINE HCL 100 MG/ML IJ SOLN
100.0000 mg | INTRAMUSCULAR | Status: AC
  Administered 2018-08-12: 19:00:00 100 mg via INTRAVENOUS
  Filled 2018-08-12: qty 2

## 2018-08-12 MED ORDER — THIAMINE HCL 100 MG/ML IJ SOLN
100.0000 mg | Freq: Every day | INTRAMUSCULAR | Status: DC
  Administered 2018-08-13 – 2018-08-14 (×2): 100 mg via INTRAVENOUS
  Filled 2018-08-12 (×2): qty 2

## 2018-08-12 MED ORDER — LORAZEPAM 1 MG PO TABS
0.0000 mg | ORAL_TABLET | Freq: Four times a day (QID) | ORAL | Status: AC
  Administered 2018-08-12 – 2018-08-13 (×2): 1 mg via ORAL
  Filled 2018-08-12 (×3): qty 1

## 2018-08-12 MED ORDER — LORAZEPAM 2 MG/ML IJ SOLN: 0.0000 mg | Freq: Four times a day (QID) | INTRAMUSCULAR | Status: AC

## 2018-08-12 MED ORDER — LORAZEPAM 1 MG PO TABS: 0.0000 mg | ORAL_TABLET | Freq: Two times a day (BID) | ORAL | Status: DC

## 2018-08-12 MED ORDER — NICOTINE 21 MG/24HR TD PT24
21.0000 mg | MEDICATED_PATCH | Freq: Every day | TRANSDERMAL | Status: DC
  Administered 2018-08-13 – 2018-08-14 (×2): 21 mg via TRANSDERMAL
  Filled 2018-08-12 (×2): qty 1

## 2018-08-12 MED ORDER — KCL IN DEXTROSE-NACL 20-5-0.9 MEQ/L-%-% IV SOLN
INTRAVENOUS | Status: DC
  Administered 2018-08-12 – 2018-08-13 (×4): via INTRAVENOUS
  Filled 2018-08-12 (×6): qty 1000

## 2018-08-12 MED ORDER — SODIUM CHLORIDE 0.9% FLUSH
3.0000 mL | Freq: Two times a day (BID) | INTRAVENOUS | Status: DC
  Administered 2018-08-13: 3 mL via INTRAVENOUS

## 2018-08-12 MED ORDER — METHOCARBAMOL 1000 MG/10ML IJ SOLN
500.0000 mg | Freq: Four times a day (QID) | INTRAVENOUS | Status: DC
  Administered 2018-08-12 – 2018-08-13 (×2): 500 mg via INTRAVENOUS
  Filled 2018-08-12 (×8): qty 5

## 2018-08-12 MED ORDER — ONDANSETRON HCL 4 MG PO TABS: 4.0000 mg | ORAL_TABLET | Freq: Four times a day (QID) | ORAL | Status: DC | PRN

## 2018-08-12 MED ORDER — THIAMINE HCL 100 MG/ML IJ SOLN: 100.0000 mg | Freq: Every day | INTRAMUSCULAR | Status: DC

## 2018-08-12 MED ORDER — OXYCODONE-ACETAMINOPHEN 5-325 MG PO TABS
2.0000 | ORAL_TABLET | Freq: Once | ORAL | Status: AC
  Administered 2018-08-12: 10:00:00 2 via ORAL
  Filled 2018-08-12: qty 2

## 2018-08-12 MED ORDER — IBUPROFEN 200 MG PO TABS: 600.0000 mg | ORAL_TABLET | ORAL | Status: DC | PRN

## 2018-08-12 MED ORDER — ZOLPIDEM TARTRATE 5 MG PO TABS: 5.0000 mg | ORAL_TABLET | Freq: Every evening | ORAL | Status: DC | PRN

## 2018-08-12 MED ORDER — ACETAMINOPHEN 325 MG PO TABS: 650.0000 mg | ORAL_TABLET | Freq: Four times a day (QID) | ORAL | Status: DC | PRN

## 2018-08-12 MED ORDER — ACETAMINOPHEN 650 MG RE SUPP: 650.0000 mg | Freq: Four times a day (QID) | RECTAL | Status: DC | PRN

## 2018-08-12 MED ORDER — LORAZEPAM 2 MG/ML IJ SOLN: 0.0000 mg | Freq: Two times a day (BID) | INTRAMUSCULAR | Status: DC

## 2018-08-12 MED ORDER — KETOROLAC TROMETHAMINE 30 MG/ML IJ SOLN
30.0000 mg | Freq: Four times a day (QID) | INTRAMUSCULAR | Status: DC | PRN
  Administered 2018-08-12 – 2018-08-14 (×3): 30 mg via INTRAVENOUS
  Filled 2018-08-12 (×3): qty 1

## 2018-08-12 MED ORDER — MORPHINE SULFATE (PF) 4 MG/ML IV SOLN
4.0000 mg | Freq: Once | INTRAVENOUS | Status: AC
  Administered 2018-08-12: 06:00:00 4 mg via INTRAVENOUS
  Filled 2018-08-12: qty 1

## 2018-08-12 MED ORDER — POLYETHYLENE GLYCOL 3350 17 G PO PACK: 17.0000 g | PACK | Freq: Every day | ORAL | Status: DC | PRN

## 2018-08-12 MED ORDER — VITAMIN B-1 100 MG PO TABS: 100.0000 mg | ORAL_TABLET | Freq: Every day | ORAL | Status: DC

## 2018-08-12 MED ORDER — ONDANSETRON HCL 4 MG/2ML IJ SOLN: 4.0000 mg | Freq: Four times a day (QID) | INTRAMUSCULAR | Status: DC | PRN

## 2018-08-12 MED ORDER — ENOXAPARIN SODIUM 40 MG/0.4ML ~~LOC~~ SOLN
40.0000 mg | SUBCUTANEOUS | Status: DC
  Administered 2018-08-12 – 2018-08-13 (×2): 40 mg via SUBCUTANEOUS
  Filled 2018-08-12 (×2): qty 0.4

## 2018-08-12 MED ORDER — THIAMINE HCL 100 MG/ML IJ SOLN
Freq: Once | INTRAVENOUS | Status: AC
  Administered 2018-08-12: 21:00:00 via INTRAVENOUS
  Filled 2018-08-12: qty 1000

## 2018-08-12 MED ORDER — METHOCARBAMOL 500 MG PO TABS
500.0000 mg | ORAL_TABLET | Freq: Once | ORAL | Status: AC
  Administered 2018-08-12: 500 mg via ORAL
  Filled 2018-08-12: qty 1

## 2018-08-12 NOTE — ED Provider Notes (Signed)
Signout at shift change from [facility], PA-C See previous providers note for full H&P  Briefly, patient presenting with fall after drinking alcohol.  She is found to have inferior and superior pubic rami fractures.  She has been too intoxicated to get up and walk.  She was awake and sober enough to state initially that she did not have any neck pain and did not hit her head.  Plan to recheck this when she is sobered up and give pain medication and walk.  Anticipate going home if patient can walk.  On reevaluation, patient has no midline spinal tenderness and confirms that she did not hit her head or lose consciousness.  She is sobered up on my evaluation.  Patient got up to walk after given Percocet and is unable to make a step with a walker without severe muscle spasms in her anterior thigh.  Patient given Robaxin and ambulation was trialed again without improvement.  I discussed patient case with orthopedic doctor on-call, Dr. Lyla Glassing, who advised admission to medicine and CT of the pelvis.  I discussed patient case with Dr. Evangeline Gula with [facility] who accepts patient for admission.  I appreciate the above consultants for their assistance with the patient.  Patient admits to drinking 4 beers daily and smoking a pack a day.  CIWA protocol initiated as well as nicotine patch.   Frederica Kuster, PA-C 08/12/18 1721    Tegeler, Gwenyth Allegra, MD 08/12/18 1900

## 2018-08-12 NOTE — ED Notes (Signed)
Patient transported to X-ray 

## 2018-08-12 NOTE — ED Provider Notes (Signed)
[facility]   CSN: [id number] Arrival date & time: 08/12/18  0131    History   Chief Complaint Chief Complaint  Patient presents with  . Fall  . Alcohol Intoxication    HPI [name] is a 58 y.o. female with a hx of right knee, fibromyalgia, seizures presents to the Emergency Department complaining of acute, persistent left hip and groin pain onset just prior to arrival after fall.  Patient reports she was walking up 3 stairs to a deck when she lost her balance falling backwards and landing on her left hip and buttock.  She denies hitting her head or neck pain.  She does admit to alcohol usage tonight.  Patient denies back pain, numbness, tingling, loss of bowel or bladder control.  She does report she can move her left hip but it is painful.  Reports she was unable to ambulate on scene due to pain.  She arrives via EMS with c-collar in place.  Patient is adamant that she does not have neck pain.  Denies headache, neck pain, chest pain, shortness of breath, abdominal pain, nausea, vomiting, diarrhea, syncope, low back pain.        The history is provided by the patient and medical records. No language interpreter was used.    Past Medical History:  Diagnosis Date  . Anal fissure   . Anxiety   . Bipolar 1 disorder (HCC)   . Cerebral hemorrhage (HCC)   . Clotting disorder (HCC)    hx of blood clot  . Congenital single kidney   . Depression   . Endometriosis   . Fibromyalgia   . Hemorrhoids   . History of elevated homocysteine   . Seizures (HCC) 03/18/15  . 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 Anal fissure 03/02/2011  . LACERATION, SCALP 08/25/2008  . Anxiety state 05/23/2008  . EPIGASTRIC PAIN 05/23/2008  . DEEP VENOUS THROMBOPHLEBITIS, HX OF 11/14/2006  . EMBOLISM/THROMBOSIS, DEEP VSL PRXML LWR EXTRM 08/28/2006    Past  Surgical History:  Procedure Laterality Date  . APPENDECTOMY    . BREAST SURGERY     benign breast lump removal  . CESAREAN SECTION    . LAPAROSCOPY     endometriosis  . LEEP  2000  . TONSILLECTOMY    . TUBAL LIGATION       OB History   No obstetric history on file.      Home Medications    Prior to Admission medications   Medication Sig Start Date End Date Taking? Authorizing Provider  aspirin 325 MG tablet Take 325 mg by mouth every 6 (six) hours as needed for mild pain or headache.     [provider]  HYDROcodone-acetaminophen (NORCO/VICODIN) 5-325 MG tablet Take 1-2 tablets by mouth every 4 (four) hours as needed for severe pain. 08/12/15   Nelwyn SalisburyFry, Stephen A, MD  ibuprofen (ADVIL,MOTRIN) 600 MG tablet Take 1 tablet (600 mg total) by mouth every 6 (six) hours as needed. 03/26/18   Robinson, [place] N, PA-C  LORazepam (ATIVAN) 1 MG tablet TAKE 1 TABLET BY MOUTH EVERY 6 HOURS AS NEEDED 08/17/16   Nelwyn SalisburyFry, Stephen A, MD  Multiple Vitamin (MULTIVITAMIN WITH MINERALS) TABS tablet Take 1 tablet by mouth daily.    [provider]  oxyCODONE (ROXICODONE) 5 MG immediate release tablet Take 1 tablet (5 mg total)  by mouth every 6 (six) hours as needed for severe pain. 03/26/18   Robinson, [place] N, PA-C  vitamin B-12 (CYANOCOBALAMIN) 1000 MCG tablet Take 1,000 mcg by mouth daily.    [provider]    Family History Family History  Problem Relation Age of Onset  . Heart disease Mother   . Cancer Sister        breast  . Heart disease Brother   . Coronary artery disease Other   . Stroke Other   . Hypertension Other     Social History Social History   Tobacco Use  . Smoking status: Current Every Day Smoker    Packs/day: 1.00    Types: Cigarettes  . Smokeless tobacco: Never Used  Substance Use Topics  . Alcohol use: Yes    Alcohol/week: 0.0 standard drinks    Comment: frequently   . Drug use: No     Allergies   Acetaminophen and Compazine   Review of  Systems Review of Systems  Constitutional: Negative for appetite change, diaphoresis, fatigue, fever and unexpected weight change.  HENT: Negative for mouth sores.   Eyes: Negative for visual disturbance.  Respiratory: Negative for cough, chest tightness, shortness of breath and wheezing.   Cardiovascular: Negative for chest pain.  Gastrointestinal: Negative for abdominal pain, constipation, diarrhea, nausea and vomiting.  Endocrine: Negative for polydipsia, polyphagia and polyuria.  Genitourinary: Negative for dysuria, frequency, hematuria and urgency.  Musculoskeletal: Positive for arthralgias and gait problem. Negative for back pain, neck pain and neck stiffness.  Skin: Negative for rash.  Allergic/Immunologic: Negative for immunocompromised state.  Neurological: Negative for syncope, light-headedness and headaches.  Hematological: Does not bruise/bleed easily.  Psychiatric/Behavioral: Negative for sleep disturbance. The patient is not nervous/anxious.      Physical Exam Updated Vital Signs BP 115/72 (BP Location: Left Arm)   Pulse 72   Temp 98 F (36.7 C) (Oral)   Resp 16   SpO2 100%   Physical Exam Vitals signs and nursing note reviewed.  Constitutional:      General: She is not in acute distress.    Appearance: She is not diaphoretic.  HENT:     Head: Normocephalic.  Eyes:     General: No scleral icterus.    Conjunctiva/sclera: Conjunctivae normal.  Neck:     Musculoskeletal: Normal range of motion. No pain with movement, spinous process tenderness or muscular tenderness.     Comments: Collar in place.  Patient without midline or paraspinal tenderness.  No step-off or deformity.  C-collar removed and patient able to range neck without pain. Cardiovascular:     Rate and Rhythm: Normal rate and regular rhythm.     Pulses: Normal pulses.          Radial pulses are 2+ on the right side and 2+ on the left side.  Pulmonary:     Effort: No tachypnea, accessory muscle  usage, prolonged expiration, respiratory distress or retractions.     Breath sounds: No stridor.     Comments: Equal chest rise. No increased work of breathing. Chest:     Comments: No crepitus or flail segment. Abdominal:     General: There is no distension.     Palpations: Abdomen is soft.     Tenderness: There is no abdominal tenderness. There is no guarding or rebound.  Musculoskeletal:     Left hip: She exhibits tenderness.     Comments: Holds left hip in flexion at 90 degrees.  Tenderness to palpation through the  left groin and buttocks.  No palpable deformity.  Full range of motion of the left ankle.  Patient will not range knee as it causes him to hurt.  No tenderness to palpation of the left knee. Right lower extremity with full range of motion of the right hip, knee and ankle.  This does elicit some pain in the left groin.  Full range of motion of the bilateral shoulders elbows and wrists.  Skin:    General: Skin is warm and dry.     Capillary Refill: Capillary refill takes less than 2 seconds.  Neurological:     Mental Status: She is alert.     GCS: GCS eye subscore is 4. GCS verbal subscore is 5. GCS motor subscore is 6.     Comments: Speech is clear and goal oriented.  Psychiatric:        Mood and Affect: Mood normal.      ED Treatments / Results  Labs (all labs ordered are listed, but only abnormal results are displayed) Labs Reviewed  ETHANOL - Abnormal; Notable for the following components:      Result Value   Alcohol, Ethyl (B) 240 (*)    All other components within normal limits  CBC WITH DIFFERENTIAL/PLATELET - Abnormal; Notable for the following components:   WBC 12.9 (*)    MCV 108.0 (*)    MCH 35.7 (*)    RDW 16.0 (*)    Neutro Abs 11.3 (*)    Abs Immature Granulocytes 0.14 (*)    All other components within normal limits  COMPREHENSIVE METABOLIC PANEL - Abnormal; Notable for the following components:   Sodium 132 (*)    CO2 13 (*)    Calcium 8.5  (*)    Total Protein 6.4 (*)    Albumin 3.2 (*)    Anion gap 16 (*)    All other components within normal limits  PROTIME-INR    EKG EKG Interpretation  Date/Time:  Sunday August 12 2018 01:45:48 EDT Ventricular Rate:  104 PR Interval:    QRS Duration: 93 QT Interval:  347 QTC Calculation: 461 R Axis:   66 Text Interpretation:  Sinus tachycardia Low voltage, precordial leads Borderline T abnormalities, anterior leads When compared with ECG of 03/06/2013, Nonspecific T wave abnormality is now present Confirmed by Dione BoozeGlick, David ([id number]) on 08/12/2018 2:53:31 AM   Radiology Dg Hip Unilat W Or Wo Pelvis 2-3 Views Left  Result Date: 08/12/2018 CLINICAL DATA:  Fall, hip pain EXAM: DG HIP (WITH OR WITHOUT PELVIS) 2-3V LEFT COMPARISON:  None. FINDINGS: Acute mildly displaced fracture involving the left superior pubic ramus. Possible acute nondisplaced inferior pubic ramus fracture. Left femoral head demonstrates normal alignment. Pubic symphysis is intact. IMPRESSION: Acute mildly displaced left superior pubic ramus fracture with possible acute nondisplaced fracture left inferior pubic ramus Electronically Signed   By: Jasmine PangKim  Fujinaga M.D.   On: 08/12/2018 02:56    Procedures Procedures (including critical care time)  Medications Ordered in ED Medications  morphine 4 MG/ML injection 4 mg (4 mg Intravenous Given 08/12/18 0608)     Initial Impression / Assessment and Plan / ED Course  I have reviewed the triage vital signs and the nursing notes.  Pertinent labs & imaging results that were available during my care of the patient were reviewed by me and considered in my medical decision making (see chart for details).         Presents after fall.  She is easily arousable and oriented  but is intoxicated.  X-rays show mildly displaced left superior pubic ramus fracture and possibly left inferior pubic ramus.  Patient continues to complain of pain however sleepy.  Patient will need to sober before  pain control is attempted.  Patient will then need to ambulate here in the emergency department before discharge home.  If unable to ambulate she will need admission for pain control.   The patient was discussed with and seen by Dr. Roxanne Mins who agrees with the treatment plan.  At shift change care was transferred to Armstead Peaks, PA-C who will follow pending studies, re-evaulate and determine disposition.     Final Clinical Impressions(s) / ED Diagnoses   Final diagnoses:  Fall, initial encounter  Closed bilateral fracture of pubic rami, initial encounter [facility])    ED Discharge Orders    None       Gaelan Glennon, Gwenlyn Perking 76/22/63 3354    Delora Fuel, MD 56/25/63 [phone]

## 2018-08-12 NOTE — ED Notes (Signed)
Pt unable to walk. Pt continues to have "muscle spasms."

## 2018-08-12 NOTE — H&P (Signed)
History and Physical    Terri Guarneriammy E Gest RUE:[id number] DOB: Jun 15, 1960 DOA: 08/12/2018  PCP: Nelwyn SalisburyFry, Stephen A, MD  Patient coming from: Home  I have personally briefly reviewed patient's [facility]  Chief Complaint: Left hip pain  HPI: [name] is a 58 y.o. female with medical history significant of street of right knee trouble, fibromyalgia, and seizures presents emergency department complaining of acute persistent left hip pain and groin pain onset just prior to arrival after her fall at home.  Walking up 3 stairs to her deck when she lost her balance.  She fell backwards and landed on her left hip and buttock.  She denies hitting her head or neck pain.  She does admit to alcohol use tonight.  Patient denies back pain, numbness, tingling, loss of bowel or bladder control.  She does respond she can move the left hip but it is painful.  She was unable to ambulate on the scene due to pain.  She arrives via EMS with c-collar in place.  She is adamant that she does not have neck pain.  She denies any headache, neck pain, chest pain, shortness of breath, abdominal pain, nausea, vomiting, diarrhea, syncope, low back pain, headaches,  urinary frequency or urgency.  ED Course: Found to have a displaced left superior pubic ramus fracture with an acute nondisplaced left inferior pubic ramus fracture.  CO2 13 anion gap 16, level 240, to me for further evaluation and management  Review of Systems: As per HPI otherwise all other systems reviewed and  negative.   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 She has been smoking about 1.00 pack per day. She has never used smokeless tobacco. She reports current alcohol use of about 28.0 standard drinks of alcohol per week. She reports that she does not use drugs.  Allergies  Allergen Reactions   Acetaminophen Other (See Comments)    Patient stated, " I overdosed on it."   Compazine Other (See Comments)    Convulsions/shaking and couldn't swallow anything    Family History  Problem Relation Age of Onset   Heart disease Mother    Cancer Sister        breast   Heart disease Brother    Coronary artery disease Other    Stroke Other    Hypertension Other      Prior to Admission medications   Medication Sig Start Date End Date Taking? Authorizing Provider  aspirin 325 MG tablet Take 325 mg by mouth every 6 (  six) hours as needed for mild pain or headache.     [provider]  HYDROcodone-acetaminophen (NORCO/VICODIN) 5-325 MG tablet Take 1-2 tablets by mouth every 4 (four) hours as needed for severe pain. 08/12/15   Nelwyn SalisburyFry, Stephen A, MD  ibuprofen (ADVIL,MOTRIN) 600 MG tablet Take 1 tablet (600 mg total) by mouth every 6 (six) hours as needed. 03/26/18   Robinson, [place]il[place] N, PA-C  LORazepam (ATIVAN) 1 MG tablet TAKE 1 TABLET BY MOUTH EVERY 6 HOURS AS NEEDED 08/17/16   Nelwyn SalisburyFry, Stephen A, MD  Multiple Vitamin (MULTIVITAMIN WITH MINERALS) TABS tablet Take 1 tablet by mouth daily.    [provider]  oxyCODONE (ROXICODONE) 5 MG immediate release tablet Take 1 tablet (5 mg total) by mouth every 6 (six) hours as needed for severe pain. 03/26/18   Robinson, [place]il[place] N, PA-C  vitamin B-12  (CYANOCOBALAMIN) 1000 MCG tablet Take 1,000 mcg by mouth daily.    [provider]    Physical Exam:  Constitutional: NAD, agitated, uncomfortable Vitals:   08/12/18 1145 08/12/18 1516 08/12/18 1544 08/12/18 1559  BP:  140/69 138/68 138/68  Pulse:   60 60  Resp: 13 16 16    Temp:      TempSrc:      SpO2:   95%    Eyes: PERRL, lids and conjunctivae normal ENMT: Mucous membranes are moist. Posterior pharynx clear of any exudate or lesions.Normal dentition.  Neck: normal, supple, no masses, no thyromegaly Respiratory: clear to auscultation bilaterally, no wheezing, no crackles. Normal respiratory effort. No accessory muscle use.  Cardiovascular: Regular rate and rhythm, no murmurs / rubs / gallops. No extremity edema. 2+ pedal pulses. No carotid bruits.  Abdomen: no tenderness, no masses palpated. No hepatosplenomegaly. Bowel sounds positive.  Musculoskeletal: no clubbing / cyanosis. No joint deformity upper and lower extremities. Good ROM except for left hip, no contractures. Normal muscle tone.  Skin: no rashes, lesions, ulcers. No induration Neurologic: CN 2-12 grossly intact. Sensation intact, DTR normal. Strength 4/5 in all 4.  Psychiatric: Normal judgment and insight. Alert and oriented x 3. Normal mood.    Labs on Admission: I have personally reviewed following labs and imaging studies  CBC: Recent Labs  Lab 08/12/18 0154  WBC 12.9*  NEUTROABS 11.3*  HGB 13.9  HCT 42.0  MCV 108.0*  PLT 240   Basic Metabolic Panel: Recent Labs  Lab 08/12/18 0154  NA 132*  K 3.9  CL 103  CO2 13*  GLUCOSE 94  BUN 6  CREATININE 0.95  CALCIUM 8.5*   Liver Function Tests: Recent Labs  Lab 08/12/18 0154  AST 26  ALT 16  ALKPHOS 106  BILITOT 0.8  PROT 6.4*  ALBUMIN 3.2*   Coagulation Profile: Recent Labs  Lab 08/12/18 0154  INR 1.0   Urine analysis:    Component Value Date/Time   COLORURINE YELLOW 03/06/2013 1650   APPEARANCEUR CLOUDY (A) 03/06/2013 1650     LABSPEC 1.005 03/06/2013 1650   PHURINE 6.0 03/06/2013 1650   GLUCOSEU NEGATIVE 03/06/2013 1650   HGBUR NEGATIVE 03/06/2013 1650   HGBUR trace-lysed 01/13/2010 0852   BILIRUBINUR n 03/31/2015 1322   KETONESUR NEGATIVE 03/06/2013 1650   PROTEINUR trace 03/31/2015 1322   PROTEINUR NEGATIVE 03/06/2013 1650   UROBILINOGEN 1.0 03/31/2015 1322   UROBILINOGEN 0.2 03/06/2013 1650   NITRITE positive 03/31/2015 1322   NITRITE POSITIVE (A) 03/06/2013 1650   LEUKOCYTESUR large (3+) (A) 03/31/2015 1322    Radiological Exams on Admission: Dg Hip Unilat  W Or Wo Pelvis 2-3 Views Left  Result Date: 08/12/2018 CLINICAL DATA:  Fall, hip pain EXAM: DG HIP (WITH OR WITHOUT PELVIS) 2-3V LEFT COMPARISON:  None. FINDINGS: Acute mildly displaced fracture involving the left superior pubic ramus. Possible acute nondisplaced inferior pubic ramus fracture. Left femoral head demonstrates normal alignment. Pubic symphysis is intact. IMPRESSION: Acute mildly displaced left superior pubic ramus fracture with possible acute nondisplaced fracture left inferior pubic ramus Electronically Signed   By: Donavan Foil M.D.   On: 08/12/2018 02:56    EKG: Independently reviewed.  It is tachycardia with nonspecific ST-T wave abnormalities compared to March 06, 2013 T wave abnormalities are new.  Assessment/Plan Active Problems:   Alcoholic ketoacidosis   Pubic ramus fracture, left, closed, initial encounter (HCC)   Hyponatremia   Acute alcohol intoxication (HCC)   Fibromyalgia   Abnormality of gait and mobility    1.  Alcoholic ketoacidosis Patient to receive thiamine and then will receive D5 normal saline with 20 of potassium.  This should help her normalize her electrolytes.  2.  Left inferior and superior pubic ramus fractures with displaced superior fracture: Management per orthopedics.  They recommend checking a CT scan.  SPECT this may be due to rule out left hip fracture.  3.  Hyponatremia: Should resolve  with D5 normal saline with 20 of potassium repletion.  Do not believe patient requires 3% saline.  4.  Acute alcohol intoxication: Continue CIWA scoring and IV Ativan as needed.  5.  Fibromyalgia: Continue home medication regimen.  6.  Abnormality of gait and mobility: Patient to be evaluated by PT and OT she may require outpatient, home health or inpatient skilled services.  DVT prophylaxis: Lovenox Code Status: Full code Family Communication: No family communication patient retains capacity Disposition Plan: Unknown but hopefully stable for discharge in 3 to 4 days Consults called: Dr. Lyla Glassing from orthopedics Admission status: Inpatient  Admit - It is my clinical opinion that admission to INPATIENT is reasonable and necessary because of the expectation that this patient will require hospital care that crosses at least 2 midnights to treat this condition based on the medical complexity of the problems presented.  Given the aforementioned information, the predictability of an adverse outcome is felt to be significant.   Lady Deutscher MD FACP [facility] Pager [phone]  How to contact the [facility] Attending or Consulting provider [place] or covering provider during after hours [place], for this patient?  1. Check the care team in [facility] and look for a) attending/consulting TR[facility] provider listed and b) the [facility] team listed 2. Log into [email or web address] and use [place] universal password to access. If you do not have the password, please contact the hospital operator. 3. Locate the [facility] provider you are looking for under [facility] and page to a number that you can be directly reached. 4. If you still have difficulty reaching the provider, please page the [facility] (Director on Call) for the Hospitalists listed on amion for assistance.  If 7PM-7AM, please contact night-coverage [email or web address] Password [facility]  08/12/2018, 4:17 PM

## 2018-08-12 NOTE — ED Notes (Signed)
ED TO INPATIENT HANDOFF REPORT  ED Nurse Name and Phone #:   S Name/Age/Gender [name] 58 y.o. female Room/Bed: 039C/039C  Code Status   Code Status: Full Code  Home/SNF/Other Home Patient oriented to: self, place, time and situation Is this baseline? Yes   Triage Complete: Triage complete  Chief Complaint etoh fall  Triage Note Patient arrived with EMS from home missed her step/lost balance and fell this evening , reports left hip pain radiating to left groin , denies LOC but admitted drinking alcohol this evening . Superficial skin abrasion at right shin . EMS applied C- collar.    Allergies Allergies  Allergen Reactions  . Acetaminophen Other (See Comments)    Patient stated, " I overdosed on it."  . Compazine Other (See Comments)    Convulsions/shaking and couldn't swallow anything    Level of Care/Admitting Diagnosis ED Disposition    ED Disposition Condition Comment   Admit  Hospital Area: [place] [[id number]]  Level of Care: Med-Surg [16]  Covid Evaluation: Asymptomatic Screening Protocol (No Symptoms)  Diagnosis: Alcoholic ketoacidosis [627035]  Admitting Physician: Lady Deutscher [[id number]]  Attending Physician: Lady Deutscher [[id number]]  Estimated length of stay: 3 - 4 days  Certification:: I certify this patient will need inpatient services for at least 2 midnights  PT Class (Do Not Modify): Inpatient [101]  PT Acc Code (Do Not Modify): Private [1]       B Medical/Surgery History Past Medical History:  Diagnosis Date  . Anal fissure   . Anxiety   . Bipolar 1 disorder ([place])   . Cerebral hemorrhage ([place])   . Clotting disorder (HCC)    hx of blood clot  . Congenital single kidney   . Depression   . Endometriosis   . Fibromyalgia   . Hemorrhoids   . History of elevated homocysteine   . Seizures ([place]) 03/18/15  . 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 APPENDECTOMY    . BREAST SURGERY     benign breast lump removal  . CESAREAN SECTION    . LAPAROSCOPY     endometriosis  . LEEP  2000  . TONSILLECTOMY    . TUBAL LIGATION       A IV Location/Drains/Wounds Patient Lines/Drains/Airways Status   Active Line/Drains/Airways    Name:   Placement date:   Placement time:   Site:   Days:   Peripheral IV 08/12/18 Right Antecubital   08/12/18    0607    Antecubital   less than 1          Intake/Output Last 24 hours No intake or output data in the 24 hours ending 08/12/18 1646  Labs/Imaging Results for orders placed or performed during the hospital encounter of 08/12/18 (from the past 48 hour(s))  Ethanol     Status: Abnormal   Collection Time: 08/12/18  1:54 AM  Result Value Ref Range   Alcohol, Ethyl (B) 240 (H) <10 mg/dL    Comment: (NOTE) Lowest detectable limit for serum alcohol is 10 mg/dL. For medical purposes only. Performed at [facility], [place] [address], [place], [place] [address]   CBC with Differential     Status: Abnormal   Collection Time: 08/12/18  1:54 AM  Result Value Ref Range   WBC 12.9 (H) 4.0 - 10.5 K/uL   RBC 3.89 3.87 - 5.11 MIL/uL  Hemoglobin 13.9 12.0 - 15.0 g/dL   HCT 16.142.0 09.636.0 - 04.546.0 %   MCV 108.0 (H) 80.0 - 100.0 fL   MCH 35.7 (H) 26.0 - 34.0 pg   MCHC 33.1 30.0 - 36.0 g/dL   RDW 40.916.0 (H) 81.111.5 - 91.415.5 %   Platelets 240 150 - 400 K/uL   nRBC 0.0 0.0 - 0.2 %   Neutrophils Relative % 88 %   Neutro Abs 11.3 (H) 1.7 - 7.7 K/uL   Lymphocytes Relative 5 %   Lymphs Abs 0.7 0.7 - 4.0 K/uL   Monocytes Relative 6 %   Monocytes Absolute 0.8 0.1 - 1.0 K/uL   Eosinophils Relative 0 %   Eosinophils Absolute 0.0 0.0 - 0.5 K/uL   Basophils Relative 0 %   Basophils Absolute 0.0 0.0 - 0.1 K/uL   Immature Granulocytes 1 %   Abs Immature Granulocytes 0.14 (H) 0.00 - 0.07 K/uL    Comment: Performed at [facility], [address] [place], [place] [address]   Comprehensive metabolic panel     Status: Abnormal   Collection Time: 08/12/18  1:54 AM  Result Value Ref Range   Sodium 132 (L) 135 - 145 mmol/L   Potassium 3.9 3.5 - 5.1 mmol/L   Chloride 103 98 - 111 mmol/L   CO2 13 (L) 22 - 32 mmol/L   Glucose, Bld 94 70 - 99 mg/dL   BUN 6 6 - 20 mg/dL   Creatinine, Ser 6.210.95 0.44 - 1.00 mg/dL   Calcium 8.5 (L) 8.9 - 10.3 mg/dL   Total Protein 6.4 (L) 6.5 - 8.1 g/dL   Albumin 3.2 (L) 3.5 - 5.0 g/dL   AST 26 15 - 41 U/L   ALT 16 0 - 44 U/L   Alkaline Phosphatase 106 38 - 126 U/L   Total Bilirubin 0.8 0.3 - 1.2 mg/dL   GFR calc non Af Amer >60 >60 mL/min   GFR calc Af Amer >60 >60 mL/min   Anion gap 16 (H) 5 - 15    Comment: Performed at [facility], [address] [place], [place] [address]  Protime-INR     Status: None   Collection Time: 08/12/18  1:54 AM  Result Value Ref Range   Prothrombin Time 13.1 11.4 - 15.2 seconds   INR 1.0 0.8 - 1.2    Comment: (NOTE) INR goal varies based on device and disease states. Performed at [facility], [address] [place], [place] [address]    Dg Hip Lucienne CapersUnilat W Or Wo Pelvis 2-3 Views Left  Result Date: 08/12/2018 CLINICAL DATA:  Fall, hip pain EXAM: DG HIP (WITH OR WITHOUT PELVIS) 2-3V LEFT COMPARISON:  None. FINDINGS: Acute mildly displaced fracture involving the left superior pubic ramus. Possible acute nondisplaced inferior pubic ramus fracture. Left femoral head demonstrates normal alignment. Pubic symphysis is intact. IMPRESSION: Acute mildly displaced left superior pubic ramus fracture with possible acute nondisplaced fracture left inferior pubic ramus Electronically Signed   By: Jasmine PangKim  Fujinaga M.D.   On: 08/12/2018 02:56    Pending Labs Unresulted Labs (From admission, onward)    Start     Ordered   08/19/18 0500  Creatinine, serum  (enoxaparin (LOVENOX)    CrCl >/= 30 ml/min)  Weekly,   R    Comments: while on enoxaparin therapy    08/12/18 1617   08/13/18 0500  Basic metabolic  panel  Tomorrow morning,   R     08/12/18  1617   08/13/18 0500  CBC  Tomorrow morning,   R     08/12/18 1617   08/12/18 1610  SARS Coronavirus 2 (CEPHEID - Performed in [facility] hospital lab), Hosp Order  (Asymptomatic Patients Labs)  Once,   STAT    Question:  Rule Out  Answer:  Yes   08/12/18 1617   08/12/18 1610  HIV antibody (Routine Testing)  Once,   STAT     08/12/18 1617   08/12/18 1610  CBC  (enoxaparin (LOVENOX)    CrCl >/= 30 ml/min)  Once,   STAT    Comments: Baseline for enoxaparin therapy IF NOT ALREADY DRAWN.  Notify MD if PLT < 100 K.    08/12/18 1617   08/12/18 1610  Creatinine, serum  (enoxaparin (LOVENOX)    CrCl >/= 30 ml/min)  Once,   STAT    Comments: Baseline for enoxaparin therapy IF NOT ALREADY DRAWN.    08/12/18 1617          Vitals/Pain Today's Vitals   08/12/18 1530 08/12/18 1544 08/12/18 1559 08/12/18 1615  BP: 138/68 138/68 138/68 139/74  Pulse:  60 60 (!) 58  Resp: 11 16  12   Temp:      TempSrc:      SpO2:  95%  96%  PainSc:        Isolation Precautions No active isolations  Medications Medications  nicotine (NICODERM CQ - dosed in mg/24 hours) patch 21 mg (has no administration in time range)  LORazepam (ATIVAN) injection 0-4 mg (0 mg Intravenous Not Given 08/12/18 1645)    Or  LORazepam (ATIVAN) tablet 0-4 mg ( Oral See Alternative 08/12/18 1645)  LORazepam (ATIVAN) injection 0-4 mg (has no administration in time range)    Or  LORazepam (ATIVAN) tablet 0-4 mg (has no administration in time range)  thiamine (B-1) injection 100 mg (has no administration in time range)  thiamine (B-1) injection 100 mg (has no administration in time range)  enoxaparin (LOVENOX) injection 40 mg (has no administration in time range)  sodium chloride flush (NS) 0.9 % injection 3 mL (has no administration in time range)  sodium chloride 0.9 % 1,000 mL with thiamine 100 mg, folic acid 1 mg, multivitamins adult 10 mL infusion (has no administration in time range)   dextrose 5 % and 0.9 % NaCl with KCl 20 mEq/L infusion (has no administration in time range)  acetaminophen (TYLENOL) tablet 650 mg (has no administration in time range)    Or  acetaminophen (TYLENOL) suppository 650 mg (has no administration in time range)  ibuprofen (ADVIL) tablet 600 mg (has no administration in time range)  ketorolac (TORADOL) 30 MG/ML injection 30 mg (has no administration in time range)  zolpidem (AMBIEN) tablet 5 mg (has no administration in time range)  polyethylene glycol (MIRALAX / GLYCOLAX) packet 17 g (has no administration in time range)  ondansetron (ZOFRAN) tablet 4 mg (has no administration in time range)    Or  ondansetron (ZOFRAN) injection 4 mg (has no administration in time range)  methocarbamol (ROBAXIN) 500 mg in dextrose 5 % 50 mL IVPB (has no administration in time range)  morphine 4 MG/ML injection 4 mg (4 mg Intravenous Given 08/12/18 0608)  oxyCODONE-acetaminophen (PERCOCET/ROXICET) 5-325 MG per tablet 2 tablet (2 tablets Oral Given 08/12/18 0945)  methocarbamol (ROBAXIN) tablet 500 mg (500 mg Oral Given 08/12/18 1154)    Mobility walks High fall risk   Focused Assessments Pulmonary Assessment Handoff:  Lung sounds:  O2 Device: Room Air        R Recommendations: See Admitting Provider Note  Report given to:   Additional Notes:

## 2018-08-12 NOTE — ED Triage Notes (Signed)
Patient arrived with EMS from home missed her step/lost balance and fell this evening , reports left hip pain radiating to left groin , denies LOC but admitted drinking alcohol this evening . Superficial skin abrasion at right shin . EMS applied C- collar.

## 2018-08-12 NOTE — ED Notes (Signed)
Tech attempted to ambulate pt. Pt was unable to move herself in the bed.Tech tried to help pt sit up however pt was in too much pain. Pt stated "theres a cramp in the back of my leg." Tech notified PA and audrey, Therapist, sports of situation.

## 2018-08-12 NOTE — Progress Notes (Signed)
Received Pt on a stretcher, assisted to bed, pain 9/10 and complaining of muscle cramps. PRN med given. Pt stated she gave her keys to a staff in the ED for it to be given to [place]. Called ED and RN was informed that they don't have the keys. Searched linen bag, trash can and Pt belongings but could not find her keys. Advised Pt to look for it in her pocketbook but Pt stated she never puts her keys inside the bag and it's not there. Endorsed accordingly.

## 2018-08-12 NOTE — Consult Note (Addendum)
ORTHOPAEDIC CONSULTATION  REQUESTING PHYSICIAN: Lahoma CrockerSheehan, Theresa C, MD  PCP:  Nelwyn SalisburyFry, Stephen A, MD  Chief Complaint: Pelvic ring fracture  HPI: [name] is a 58 y.o. female with a history of fibromyalgia, seizures, chronic knee pain who complains of left sided hip and pelvic pain after she had a ground-level fall.  She was walking up 3 stairs on her deck when she lost her balance and fell onto her left hip.  She denies other injuries.  She had left hip pain and difficulty weightbearing.  [facility] was [facility] where x-rays of the pelvis revealed left LC 1 pelvis fracture.  She was admitted to the hospitalist for pain control.  Orthopedic consultation was placed for management of pelvic fracture.  Patient was found to have acute alcohol intoxication upon admission.  Past Medical History:  Diagnosis Date  . Anal fissure   . Anxiety   . Bipolar 1 disorder (HCC)   . Cerebral hemorrhage (HCC)   . Clotting disorder (HCC)    hx of blood clot  . Congenital single kidney   . Depression   . Endometriosis   . Fibromyalgia   . Hemorrhoids   . History of elevated homocysteine   . Seizures (HCC) 03/18/15  . 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 APPENDECTOMY    . BREAST SURGERY     benign breast lump removal  . CESAREAN SECTION    . LAPAROSCOPY     endometriosis  . LEEP  2000  . TONSILLECTOMY    . TUBAL LIGATION     Social History   Socioeconomic History  . Marital status: Divorced    Spouse name: Not on file  . Number of children: 2  . Years of education: 5512  . Highest education level: Not on file  Occupational History    Comment: home maker  Social Needs  . Financial resource strain: Not on file  . Food insecurity    Worry: Not on file    Inability: Not on file  . Transportation needs    Medical: Not on file    Non-medical: Not on file   Tobacco Use  . Smoking status: Current Every Day Smoker    Packs/day: 1.00    Types: Cigarettes  . Smokeless tobacco: Never Used  Substance and Sexual Activity  . Alcohol use: Yes    Alcohol/week: 28.0 standard drinks    Types: 28 Cans of beer per week    Comment: frequently   . Drug use: No  . Sexual activity: Not on file    Comment: unknown  Lifestyle  . Physical activity    Days per week: Not on file    Minutes per session: Not on file  . Stress: Not on file  Relationships  . Social Musicianconnections    Talks on phone: Not on file    Gets together: Not on file    Attends religious service: Not on file    Active member of club or organization: Not on file    Attends meetings of clubs or organizations: Not on file    Relationship status: Not on file  Other Topics Concern  . Not on file  Social History Narrative  . Not on file   Family History  Problem Relation Age of Onset  . Heart disease Mother   . Cancer Sister  breast  . Heart disease Brother   . Coronary artery disease Other   . Stroke Other   . Hypertension Other    Allergies  Allergen Reactions  . Acetaminophen Other (See Comments)    Patient stated, " I overdosed on it."  . Compazine Other (See Comments)    Convulsions/shaking and couldn't swallow anything   Prior to Admission medications   Medication Sig Start Date End Date Taking? Authorizing Provider  aspirin 325 MG tablet Take 325 mg by mouth every 6 (six) hours as needed for mild pain or headache.     [provider]  HYDROcodone-acetaminophen (NORCO/VICODIN) 5-325 MG tablet Take 1-2 tablets by mouth every 4 (four) hours as needed for severe pain. 08/12/15   Laurey Morale, MD  ibuprofen (ADVIL,MOTRIN) 600 MG tablet Take 1 tablet (600 mg total) by mouth every 6 (six) hours as needed. 03/26/18   Robinson, [place] N, PA-C  LORazepam (ATIVAN) 1 MG tablet TAKE 1 TABLET BY MOUTH EVERY 6 HOURS AS NEEDED 08/17/16   Laurey Morale, MD  Multiple  Vitamin (MULTIVITAMIN WITH MINERALS) TABS tablet Take 1 tablet by mouth daily.    [provider]  oxyCODONE (ROXICODONE) 5 MG immediate release tablet Take 1 tablet (5 mg total) by mouth every 6 (six) hours as needed for severe pain. 03/26/18   Robinson, [place] N, PA-C  vitamin B-12 (CYANOCOBALAMIN) 1000 MCG tablet Take 1,000 mcg by mouth daily.    [provider]   Dg Hip Unilat W Or Wo Pelvis 2-3 Views Left  Result Date: 08/12/2018 CLINICAL DATA:  Fall, hip pain EXAM: DG HIP (WITH OR WITHOUT PELVIS) 2-3V LEFT COMPARISON:  None. FINDINGS: Acute mildly displaced fracture involving the left superior pubic ramus. Possible acute nondisplaced inferior pubic ramus fracture. Left femoral head demonstrates normal alignment. Pubic symphysis is intact. IMPRESSION: Acute mildly displaced left superior pubic ramus fracture with possible acute nondisplaced fracture left inferior pubic ramus Electronically Signed   By: Donavan Foil M.D.   On: 08/12/2018 02:56    Positive ROS: All other systems have been reviewed and were otherwise negative with the exception of those mentioned in the HPI and as above.  Physical Exam: General: Alert, no acute distress Cardiovascular: No pedal edema Respiratory: No cyanosis, no use of accessory musculature GI: No organomegaly, abdomen is soft and non-tender Skin: No lesions in the area of chief complaint Neurologic: Sensation intact distally Psychiatric: Patient is competent for consent with normal mood and affect Lymphatic: No axillary or cervical lymphadenopathy  MUSCULOSKELETAL: Examination of the pelvis reveals no skin wounds or lesions.  She has pain with AP and lateral compression of the pelvis.  There is no pelvic instability.  She has painless logrolling of both hips.  She is able to perform a straight leg raise bilaterally.  She has full symmetric range of motion of both knees.  It does not his ankle and feet are atraumatic without swelling or  crepitation.  She has surgical positive motor function dorsiflexion, plantarflexion, great toe extension.  She has palpable pedal pulses.  She reports intact sensation in all distributions.  Assessment: Left LC 1 pelvis fracture.  Plan: I discussed the findings with the patient.  X-rays consistent with left LC 1 pelvis fracture.  I recommend CT scan of the pelvis to rule out posterior ring involvement.  Patient can likely have nonoperative management, weight-bear as tolerated with a walker.  We will leave final recommendations once CT scan is done.  All questions  solicited and answered.    Jonette PesaBrian J Ashe Graybeal, MD Cell [phone]    08/12/2018 7:12 PM      ADDENDUM: CT pelvis reviewed. L sup/inf rami fx with Denis I sacrum. Stable fx. Treat nonop WBAT with walker. F/u with me in 2 weeks. 10:48 PM

## 2018-08-12 NOTE — Plan of Care (Signed)

## 2018-08-13 ENCOUNTER — Encounter (HOSPITAL_COMMUNITY): Payer: Self-pay | Admitting: General Practice

## 2018-08-13 DIAGNOSIS — W19XXXA Unspecified fall, initial encounter: Secondary | ICD-10-CM

## 2018-08-13 LAB — BASIC METABOLIC PANEL
Anion gap: 9 (ref 5–15)
BUN: 6 mg/dL (ref 6–20)
CO2: 20 mmol/L — ABNORMAL LOW (ref 22–32)
Calcium: 8.2 mg/dL — ABNORMAL LOW (ref 8.9–10.3)
Chloride: 109 mmol/L (ref 98–111)
Creatinine, Ser: 0.89 mg/dL (ref 0.44–1.00)
GFR calc Af Amer: >60 mL/min (ref >60)
GFR calc non Af Amer: >60 mL/min (ref >60)
Glucose, Bld: 118 mg/dL — ABNORMAL HIGH (ref 70–99)
Potassium: 4 mmol/L (ref 3.5–5.1)
Sodium: 138 mmol/L (ref 135–145)

## 2018-08-13 LAB — CBC
HCT: 37.6 % (ref 36.0–46.0)
Hemoglobin: 12.6 g/dL (ref 12.0–15.0)
MCH: 36 pg — ABNORMAL HIGH (ref 26.0–34.0)
MCHC: 33.5 g/dL (ref 30.0–36.0)
MCV: 107.4 fL — ABNORMAL HIGH (ref 80.0–100.0)
Platelets: 161 10*3/uL (ref 150–400)
RBC: 3.5 MIL/uL — ABNORMAL LOW (ref 3.87–5.11)
RDW: 15.3 % (ref 11.5–15.5)
WBC: 6 10*3/uL (ref 4.0–10.5)
nRBC: 0 % (ref 0.0–0.2)

## 2018-08-13 LAB — HIV ANTIBODY (ROUTINE TESTING W REFLEX): HIV Screen 4th Generation wRfx: NONREACTIVE

## 2018-08-13 MED ORDER — ENSURE ENLIVE PO LIQD
237.0000 mL | Freq: Two times a day (BID) | ORAL | Status: DC
  Administered 2018-08-13 – 2018-08-14 (×2): 237 mL via ORAL

## 2018-08-13 MED ORDER — METHOCARBAMOL 500 MG PO TABS
500.0000 mg | ORAL_TABLET | Freq: Four times a day (QID) | ORAL | Status: DC
  Administered 2018-08-13 – 2018-08-14 (×5): 500 mg via ORAL
  Filled 2018-08-13 (×5): qty 1

## 2018-08-13 NOTE — Progress Notes (Signed)
Inpatient Rehabilitation Admissions Coordinator  Noted OT now recommends HH. I will sign off at this time.  Danne Baxter, RN, MSN Rehab Admissions Coordinator [phone] 08/13/2018 3:36 PM

## 2018-08-13 NOTE — Evaluation (Signed)
Physical Therapy Evaluation Patient Details Name: [name] MRN: [id number] DOB: [birth year] Today's Date: 08/13/2018   History of Present Illness  58 yo female with onset of fall at home resulted in L inferior and superior pubic ramus fractures, displaced superior fracture, L sacral ala fracture.  Pt is on CIWA protocol.  WBAT.  PMHx:  bipolar, anxiety, clotting disorder, cerebral hemorrhage, fibromyalgia, seizures, ortical sinus and venous sinus thrombosis, depresion, congenital single kidney  Clinical Impression  Pt was seen for mobility and to assess her control of gait and transfers for home, but is in pain and struggling.  Her concern is that she wants to go directly home with family and friends to call, but is quite limited for her tolerance with the magnitude of fractures she has.  Will recommend CIR, may default to SNF if she is not accepted but will benefit as she is unable to move even to side of bed without help.  Follow acutely to work toward stairs as she is likely to refuse to to go to rehab and go home.    Follow Up Recommendations CIR    Equipment Recommendations  None recommended by PT    Recommendations for Other Services Rehab consult     Precautions / Restrictions Precautions Precautions: Fall Precaution Comments: premedicate Restrictions Weight Bearing Restrictions: Yes LLE Weight Bearing: Weight bearing as tolerated      Mobility  Bed Mobility Overal bed mobility: Needs Assistance Bed Mobility: Supine to Sit;Sit to Supine     Supine to sit: Min assist;HOB elevated(pt sits upright in bed at rest) Sit to supine: Min assist;HOB elevated(pt sits upright in bed )   General bed mobility comments: pt insisted on moving herself, used UE's to assist legs onto bed  Transfers Overall transfer level: Needs assistance Equipment used: Rolling walker (2 wheeled) Transfers: Sit to/from Stand Sit to Stand: Min assist         General transfer comment: min guard  once up but cannot tolerate steps  Ambulation/Gait Ambulation/Gait assistance: Min assist Gait Distance (Feet): 2 Feet Assistive device: Rolling walker (2 wheeled);1 person hand held assist Gait Pattern/deviations: Step-to pattern;Wide base of support;Trunk flexed;Decreased stride length Gait velocity: reduced Gait velocity interpretation: <1.8 ft/sec, indicate of risk for recurrent falls General Gait Details: sidesteps on side of bed, min assist with walker but finally had her sidescoot to [place]: (unable)          Wheelchair Mobility    Modified Rankin (Stroke Patients Only)       Balance Overall balance assessment: History of Falls;Needs assistance Sitting-balance support: Feet supported Sitting balance-Leahy Scale: Fair     Standing balance support: Bilateral upper extremity supported;During functional activity Standing balance-Leahy Scale: Poor                               Pertinent Vitals/Pain Pain Assessment: Faces Faces Pain Scale: Hurts whole lot Pain Location: L hip Pain Descriptors / Indicators: Grimacing Pain Intervention(s): Monitored during session;Repositioned;Patient requesting pain meds-RN notified;RN gave pain meds during session    Home Living Family/patient expects to be discharged to:: Private residence Living Arrangements: Alone Available Help at Discharge: Family;Available PRN/intermittently Type of Home: House Home Access: Stairs to enter Entrance Stairs-Rails: Can reach both Entrance Stairs-Number of Steps: 3 Home Layout: One level Home Equipment: Walker - 2 wheels;Cane - single point;Bedside commode;Wheelchair - manual Additional Comments: has full equipment for home  Prior Function Level of Independence: Independent         Comments: prior to fall was not on AD     Hand Dominance   Dominant Hand: Right    Extremity/Trunk Assessment   Upper Extremity Assessment Upper Extremity Assessment:  Overall WFL for tasks assessed    Lower Extremity Assessment Lower Extremity Assessment: Generalized weakness    Cervical / Trunk Assessment Cervical / Trunk Assessment: Other exceptions(sacral ala fracture)  Communication   Communication: No difficulties  Cognition Arousal/Alertness: Awake/alert Behavior During Therapy: Anxious Overall Cognitive Status: Within Functional Limits for tasks assessed                                 General Comments: pt is anxious to go home but cannot mobilize well and is unclear about the exact amount of help she has      General Comments General comments (skin integrity, edema, etc.): pt in pain and cannot manage her mobility well to try stairs which are her entrance to her home    Exercises     Assessment/Plan    PT Assessment Patient needs continued PT services  PT Problem List Decreased strength;Decreased range of motion;Decreased activity tolerance;Decreased balance;Decreased mobility;Decreased coordination;Decreased knowledge of use of DME;Decreased safety awareness;Pain       PT Treatment Interventions DME instruction;Gait training;Stair training;Functional mobility training;Therapeutic activities;Therapeutic exercise;Balance training;Neuromuscular re-education;Patient/family education    PT Goals (Current goals can be found in the Care Plan section)  Acute Rehab PT Goals Patient Stated Goal: to get home and just call people as needed PT Goal Formulation: With patient Time For Goal Achievement: 09/10/18 Potential to Achieve Goals: Good    Frequency Min 4X/week   Barriers to discharge Inaccessible home environment;Decreased caregiver support home with stairs to enter house and alone    Co-evaluation               AM-PAC PT "6 Clicks" Mobility  Outcome Measure Help needed turning from your back to your side while in a flat bed without using bedrails?: A Little Help needed moving from lying on your back to  sitting on the side of a flat bed without using bedrails?: A Little Help needed moving to and from a bed to a chair (including a wheelchair)?: A Little Help needed standing up from a chair using your arms (e.g., wheelchair or bedside chair)?: A Little Help needed to walk in hospital room?: A Little Help needed climbing 3-5 steps with a railing? : Total 6 Click Score: 16    End of Session Equipment Utilized During Treatment: Gait belt Activity Tolerance: Patient limited by pain Patient left: in bed;with call bell/phone within reach;with bed alarm set;with nursing/sitter in room Nurse Communication: Mobility status PT Visit Diagnosis: Unsteadiness on feet (R26.81);Pain;Muscle weakness (generalized) (M62.81);History of falling (Z91.81) Pain - Right/Left: Left Pain - part of body: Hip    Time: [id number] PT Time Calculation (min) (ACUTE ONLY): 24 min   Charges:   PT Evaluation $PT Eval Moderate Complexity: 1 Mod PT Treatments $Gait Training: 8-22 mins       Ivar DrapeRuth E Artrell Lawless 08/13/2018, 8:24 AM  Samul Dadauth Rondy Krupinski, PT MS Acute Rehab Dept. Number: [facility] [phone] and [facility] [phone]

## 2018-08-13 NOTE — Progress Notes (Signed)
Rehab Admissions Coordinator Note:  Patient was screened by Cleatrice Burke for appropriateness for an Inpatient Acute Rehab Consult per PT recs.  At this time, we are recommending await further medical workup before determining rehab venue options. I will follow.  Cleatrice Burke RN MSN 08/13/2018, 8:33 AM  I can be reached at [phone].

## 2018-08-13 NOTE — Progress Notes (Signed)
[facility] PROGRESS NOTE  [name] ZOX:[id number] DOB: 09-15-1960 DOA: 08/12/2018 PCP: Nelwyn SalisburyFry, Stephen A, MD  P Mechanical fall sacral insufficiency fractures Up with therapy weightbearing activity as tolerated pain is not severe at rest but is severe with movement Needs to stay in hospital at least overnight to determine best pain regimen-for now continue ibuprofen 600 every 4 as needed, Toradol a second choice-use Robaxin 500 every 6 hourly p.o. 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 Prior CVA, congenital single kidney, heavy smoker, prior suicidality-all stable medical issues at this time   --- Synopsis 58 year old Caucasian female Prior abscesses right hip lanced in 2017, prior anal fissure followed by Dr. Andrey CampanileWilson in the past Heavy smoker in the past Prior?  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-orthopedics consulted   DVT Lovenox Code Status:  Full Communication: None Disposition Plan: Likely discharge 24 to 48 hours depending on pain control   Kabrea Seeney, MD  [facility] [employer] app OR [email or web address] 7PM-7AM contact night coverage as above 08/13/2018, 8:50 AM  LOS: 1 day  ---  Consultants:  None  Procedures:  No  Antimicrobials:  No --- Today Awake alert tried to stand at side of the bed but was in some amount of pain States the spasm is worse than the pain No fever no chills   O  Vitals:  Vitals:   08/13/18 0536 08/13/18 0821  BP: (!) 146/73 (!) 144/77  Pulse: 75 83  Resp: 15 16  Temp: 98.8 F (37.1 C) 98.4 F (36.9 C)  SpO2: 99% 100%    Exam:  Asthenic Caucasian female looking older than stated age flat affect Chest clear no added sound S1-S2 no murmur Abdomen soft Able to flex knee-to-chest but with great discomfort and spasm ROM for SLR not tested because of pain however patient is able to bend her knees Sensory is intact   I have personally reviewed the following:   DATA Sodium 132-->138 CO2 13-->20 Anion gap 16-->9 WBC 12.9-->6.0 Platelet 248-->161   Scheduled Meds: . enoxaparin (LOVENOX) injection  40 mg Subcutaneous Q24H  . LORazepam  0-4 mg Intravenous Q6H   Or  . LORazepam  0-4 mg Oral Q6H  . [START ON 08/14/2018] LORazepam  0-4 mg Intravenous Q12H   Or  . [START ON 08/14/2018] LORazepam  0-4 mg Oral Q12H  . nicotine  21 mg Transdermal Daily  . sodium chloride flush  3 mL Intravenous Q12H  . thiamine injection  100 mg Intravenous Daily   Continuous Infusions: . dextrose 5 % and  0.9 % NaCl with KCl 20 mEq/L 125 mL/hr at 08/13/18 0406  . methocarbamol (ROBAXIN) IV 500 mg (08/13/18 0018)    Active Problems:   Alcoholic ketoacidosis   Pubic ramus fracture, left, closed, initial encounter ([place])   Hyponatremia   Abnormality of gait and mobility   Fibromyalgia   Acute alcohol intoxication ([place])   LOS: 1 day

## 2018-08-13 NOTE — Progress Notes (Signed)
Initial Nutrition Assessment  RD working remotely.  DOCUMENTATION CODES:   Not applicable  INTERVENTION:  Provide Ensure Enlive po BID, each supplement provides 350 kcal and 20 grams of protein.  Encourage adequate PO intake.  Recommend obtaining new weight to fully assess weight trends.   NUTRITION DIAGNOSIS:   Increased nutrient needs related to (healing) as evidenced by estimated needs.  GOAL:   Patient will meet greater than or equal to 90% of their needs  MONITOR:   PO intake, Supplement acceptance, Skin, Weight trends, Labs, I & O's  REASON FOR ASSESSMENT:   Consult Poor PO  ASSESSMENT:   58 y.o. female with medical history significant of fibromyalgia, and seizures presents with L hip pain after fall at home. Found to have a displaced left superior pubic ramus fracture with an acute nondisplaced left inferior pubic ramus fracture.  Pt with non-operative management per Orthopedic MD. Pt reports having a fair appetite currently and PTA with usual consumption of at least 1-2 meals a day. Meals usually consists of frozen pre-packaged meals, such as stouffer's lasagna. Usual body weight ~103-104 lbs. Noted to recent weight recorded since admission. Recommend obtaining to new weight to fully assess weight trends. RD to order nutritional supplements to aid in caloric and protein needs as well as in healing.   Unable to complete Nutrition-Focused physical exam at this time.   Labs and medications reviewed.   Diet Order:   Diet Order            Diet regular Room service appropriate? Yes; Fluid consistency: Thin  Diet effective now              EDUCATION NEEDS:   Not appropriate for education at this time  Skin:  Skin Assessment: Reviewed RN Assessment  Last BM:  Unknown   Height:   Ht Readings from Last 1 Encounters:  03/26/18 5\' 1"  (1.549 m)    Weight:   Wt Readings from Last 1 Encounters:  03/26/18 46.7 kg    Ideal Body Weight:  47.7 kg  BMI:   There is no height or weight on file to calculate BMI.  Estimated Nutritional Needs:   Kcal:  1550-1800  Protein:  65-80 grams  Fluid:  1.6 - 1.8 L/day    Corrin Parker, MS, RD, LDN Pager # [phone] After hours/ weekend pager # [phone]

## 2018-08-13 NOTE — Progress Notes (Signed)
Occupational Therapy Evaluation Patient Details Name: [name] MRN: [id number] DOB: 1960-02-13 Today's Date: 08/13/2018    History of Present Illness 58 yo female with onset of fall at home resulted in L inferior and superior pubic ramus fractures, displaced superior fracture, L sacral ala fracture.  Pt is on CIWA protocol.  WBAT.  PMHx:  bipolar, anxiety, clotting disorder, cerebral hemorrhage, fibromyalgia, seizures, ortical sinus and venous sinus thrombosis, depresion, congenital single kidney   Clinical Impression   PTA Pt was mod I at home - but was struggling with independence. She typically wears hospital gowns that her sister made her. Sponge bathes at baseline (shower is being re-done to be wc acecssible. She is typically ambulatory for short home distances typically. She does have all necessary DME at home. Today she is min guard for bed mobility with increased time. She was able to perform BSC transfer at min guard - but declined to sit in recliner despite 2 pillows in base because "I am just going to sit in my bed at home too" Pt able to don/doff socks at bed level. Pt will benefit from continued skilled OT in the acute setting - but declines HHOT at this time "I have friends that will take care of me, I don't want anyone coming in my house"    Follow Up Recommendations  HHOT (Pt declines); NO OT Follow Up    Equipment Recommendations  None recommended by OT(Pt has appropriate DME)    Recommendations for Other Services       Precautions / Restrictions Precautions Precautions: Fall Precaution Comments: premedicate Restrictions Weight Bearing Restrictions: Yes LLE Weight Bearing: Weight bearing as tolerated      Mobility Bed Mobility Overal bed mobility: Needs Assistance Bed Mobility: Supine to Sit;Sit to Supine     Supine to sit: HOB elevated;Min guard(pt sits upright in bed at rest) Sit to supine: HOB elevated;Min guard(pt sits upright in bed )   General bed  mobility comments: pt insisted on moving herself, used UE's to assist legs onto bed  Transfers Overall transfer level: Needs assistance Equipment used: None Transfers: Stand Pivot Transfers   Stand pivot transfers: Min guard       General transfer comment: increased time required for all aspects of movement. Education provided in that moving is what will actually act as pain management in addition to mediciation to work out soreness    Balance Overall balance assessment: History of Falls;Needs assistance Sitting-balance support: Feet supported Sitting balance-Leahy Scale: Fair     Standing balance support: Bilateral upper extremity supported;During functional activity Standing balance-Leahy Scale: Poor                             ADL either performed or assessed with clinical judgement   ADL Overall ADL's : Needs assistance/impaired Eating/Feeding: Independent;Sitting;Bed level   Grooming: Set up;Sitting;Wash/dry hands;Wash/dry face;Oral care Grooming Details (indicate cue type and reason): sitting EOB Upper Body Bathing: Set up;Sitting   Lower Body Bathing: Set up;Bed level Lower Body Bathing Details (indicate cue type and reason): sponge bathes at baseline right now due to shower being under construction, can get to feet at bedlevel Upper Body Dressing : Modified independent;Sitting   Lower Body Dressing: Supervision/safety;Bed level Lower Body Dressing Details (indicate cue type and reason): to don socks, plans on wearing hospital gowns at home "My sister made me a bunch" Toilet Transfer: Min Barrister's clerkguard;Stand-pivot;BSC Toilet Transfer Details (indicate cue type and reason):  increased time and effort, but able to complete without physical assist Toileting- Clothing Manipulation and Hygiene: Min guard;Sitting/lateral lean       Functional mobility during ADLs: Min guard(SPT only) General ADL Comments: "I am going to go home to my bed and [facility] bed. "     Vision         Perception     Praxis      Pertinent Vitals/Pain Pain Assessment: Faces Faces Pain Scale: Hurts whole lot Pain Location: L hip Pain Descriptors / Indicators: Grimacing Pain Intervention(s): Monitored during session;Repositioned     Hand Dominance Right   Extremity/Trunk Assessment Upper Extremity Assessment Upper Extremity Assessment: Overall WFL for tasks assessed   Lower Extremity Assessment Lower Extremity Assessment: Generalized weakness   Cervical / Trunk Assessment Cervical / Trunk Assessment: Other exceptions(sacral ala fracture)   Communication Communication Communication: No difficulties   Cognition Arousal/Alertness: Awake/alert Behavior During Therapy: Anxious Overall Cognitive Status: Within Functional Limits for tasks assessed                                 General Comments: pt is anxious to go home but cannot mobilize well and is unclear about the exact amount of help she has   General Comments       Exercises     Shoulder Instructions      Home Living Family/patient expects to be discharged to:: Private residence Living Arrangements: Alone Available Help at Discharge: Family;Available PRN/intermittently Type of Home: House Home Access: Stairs to enter Entergy CorporationEntrance Stairs-Number of Steps: 3 Entrance Stairs-Rails: Can reach both Home Layout: One level     Bathroom Shower/Tub: (shower under construction, sponge bathes)   Bathroom Toilet: Standard     Home Equipment: Environmental consultantWalker - 2 wheels;Cane - single point;Bedside commode;Wheelchair - manual   Additional Comments: has full equipment for home      Prior Functioning/Environment Level of Independence: Independent        Comments: prior to fall was not on AD        OT Problem List: Decreased range of motion;Decreased activity tolerance;Impaired balance (sitting and/or standing);Decreased knowledge of use of DME or AE;Pain       OT Treatment/Interventions: Self-care/ADL training;Therapeutic activities;Patient/family education;Balance training    OT Goals(Current goals can be found in the care plan section) Acute Rehab OT Goals Patient Stated Goal: to get home and just call people as needed OT Goal Formulation: With patient Time For Goal Achievement: 08/27/18 Potential to Achieve Goals: Good ADL Goals Pt Will Perform Lower Body Bathing: with modified independence;sitting/lateral leans;with adaptive equipment Pt Will Perform Lower Body Dressing: with modified independence;sitting/lateral leans;with adaptive equipment Pt Will Transfer to Toilet: with modified independence;stand pivot transfer;bedside commode Pt Will Perform Toileting - Clothing Manipulation and hygiene: with modified independence;sitting/lateral leans Additional ADL Goal #1: Pt will perform bed mobility at mod I level prior to engaging in ADL  OT Frequency: Min 2X/week   Barriers to D/C:            Co-evaluation              AM-PAC OT "6 Clicks" Daily Activity     Outcome Measure Help from another person eating meals?: None Help from another person taking care of personal grooming?: A Little Help from another person toileting, which includes using toliet, bedpan, or urinal?: A Little Help from another person bathing (including  washing, rinsing, drying)?: A Little Help from another person to put on and taking off regular upper body clothing?: A Little Help from another person to put on and taking off regular lower body clothing?: A Little 6 Click Score: 19   End of Session Equipment Utilized During Treatment: [facility]) Nurse Communication: Mobility status  Activity Tolerance: Patient limited by pain Patient left: in bed;with call bell/phone within reach;with bed alarm set  OT Visit Diagnosis: Unsteadiness on feet (R26.81);Other abnormalities of gait and mobility (R26.89);Repeated falls (R29.6);History of falling (Z91.81);Muscle weakness  (generalized) (M62.81);Pain Pain - Right/Left: Left Pain - part of body: Hip(pelvis)                Time: 2035-5974 OT Time Calculation (min): 31 min Charges:  OT General Charges $OT Visit: 1 Visit OT Evaluation $OT Eval Moderate Complexity: 1 Mod OT Treatments $Self Care/Home Management : 8-22 mins  Hulda Humphrey OTR/L Acute Rehabilitation Services Pager: [phone] Office: [place] 08/13/2018, 1:56 PM

## 2018-08-14 LAB — CBC WITH DIFFERENTIAL/PLATELET
Abs Immature Granulocytes: 0.03 10*3/uL (ref 0.00–0.07)
Basophils Absolute: 0 10*3/uL (ref 0.0–0.1)
Basophils Relative: 0 %
Eosinophils Absolute: 0.1 10*3/uL (ref 0.0–0.5)
Eosinophils Relative: 1 %
HCT: 34.2 % — ABNORMAL LOW (ref 36.0–46.0)
Hemoglobin: 11.5 g/dL — ABNORMAL LOW (ref 12.0–15.0)
Immature Granulocytes: 1 %
Lymphocytes Relative: 17 %
Lymphs Abs: 0.9 10*3/uL (ref 0.7–4.0)
MCH: 35.7 pg — ABNORMAL HIGH (ref 26.0–34.0)
MCHC: 33.6 g/dL (ref 30.0–36.0)
MCV: 106.2 fL — ABNORMAL HIGH (ref 80.0–100.0)
Monocytes Absolute: 0.6 10*3/uL (ref 0.1–1.0)
Monocytes Relative: 10 %
Neutro Abs: 3.9 10*3/uL (ref 1.7–7.7)
Neutrophils Relative %: 71 %
Platelets: 152 10*3/uL (ref 150–400)
RBC: 3.22 MIL/uL — ABNORMAL LOW (ref 3.87–5.11)
RDW: 14.9 % (ref 11.5–15.5)
WBC: 5.5 10*3/uL (ref 4.0–10.5)
nRBC: 0 % (ref 0.0–0.2)

## 2018-08-14 LAB — BASIC METABOLIC PANEL
Anion gap: 9 (ref 5–15)
BUN: <5 mg/dL — ABNORMAL LOW (ref 6–20)
CO2: 18 mmol/L — ABNORMAL LOW (ref 22–32)
Calcium: 8.3 mg/dL — ABNORMAL LOW (ref 8.9–10.3)
Chloride: 110 mmol/L (ref 98–111)
Creatinine, Ser: 0.76 mg/dL (ref 0.44–1.00)
GFR calc Af Amer: >60 mL/min (ref >60)
GFR calc non Af Amer: >60 mL/min (ref >60)
Glucose, Bld: 102 mg/dL — ABNORMAL HIGH (ref 70–99)
Potassium: 4.1 mmol/L (ref 3.5–5.1)
Sodium: 137 mmol/L (ref 135–145)

## 2018-08-14 MED ORDER — IBUPROFEN 800 MG PO TABS: 800.0000 mg | ORAL_TABLET | Freq: Four times a day (QID) | ORAL | 0 refills | Status: DC

## 2018-08-14 MED ORDER — IBUPROFEN 200 MG PO TABS
800.0000 mg | ORAL_TABLET | Freq: Four times a day (QID) | ORAL | Status: DC
  Administered 2018-08-14: 09:00:00 800 mg via ORAL
  Filled 2018-08-14: qty 4

## 2018-08-14 MED ORDER — METHOCARBAMOL 500 MG PO TABS: 500.0000 mg | ORAL_TABLET | Freq: Four times a day (QID) | ORAL | 0 refills | Status: DC

## 2018-08-14 MED ORDER — IBUPROFEN 200 MG PO TABS: 800.0000 mg | ORAL_TABLET | ORAL | Status: DC | PRN

## 2018-08-14 NOTE — Progress Notes (Signed)
Discharge education / instructions addressed; Pt  is in stable condition;not in distress.Family friend to pick her up by [employer].

## 2018-08-14 NOTE — Discharge Summary (Signed)
Physician Discharge Summary  [name] SJG:[id number] DOB: 22-Dec-1960 DOA: 08/12/2018  PCP: Laurey Morale, MD  Admit date: 08/12/2018 Discharge date: 08/14/2018  Time spent: 20 minutes  Recommendations for Outpatient Follow-up:  1. High-dose nonsteroidals, muscle relaxants prescribed on discharge no opiates 2. Patient declined home health and wishes to go home and manage on her own with her equipment despite my recommendations to try and get some help  Discharge Diagnoses:  Active Problems:   Alcoholic ketoacidosis   Pubic ramus fracture, left, closed, initial encounter ([place])   Hyponatremia   Abnormality of gait and mobility   Fibromyalgia   Acute alcohol intoxication ([place])   Discharge Condition: Fair  Diet recommendation: Heart healthy  There were no vitals filed for this visit.  History of present illness:  58 year old Caucasian female Prior abscesses right hip lanced in 2017, prior anal fissure followed by Dr. Redmond Pulling in the past Heavy smoker in the past Prior?  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-orthopedics consulted  Hospital Course:  Mechanical fall sacral insufficiency fractures Up with therapy weightbearing activity as tolerated pain is not severe at rest but is severe with movement Patient was seen by therapy services they recommended max home health-patient declines the same says that she has friends at home who can help her she was able to sit up in the bed on day of discharge with some discomfort but not overt pain I have cautioned her and made her aware of the fact that I would not be  prescribing opiates for pain control given her heavy ethanolism-I have cautioned her as well that muscle relaxant should only be used when she has severe spasm and otherwise she can use high-dose ibuprofen for pain with weightbearing Ethanolism Patient was on Ativan protocol for withdrawal-she exhibited no signs or symptoms of the same on discharge Anion gap metabolic acidosis secondary to ethanolism-blood alcohol level was 240 on admission Labs were about the same on discharge Leukocytosis on admission Likely mediated by fall-this resolved she is not having any other symptoms or signs of issues suggestive of infection Prior CVA, congenital single kidney, heavy smoker, prior suicidality-all stable medical issues at this time   Procedures:  Multiple x-rays and CT of the hip   Consultations:  Dr. Lyla Glassing of orthopedics  Discharge Exam: Vitals:   08/14/18 0435 08/14/18 0814  BP: 137/84 (!) 125/91  Pulse: (!) 101 97  Resp: 16 17  Temp: 99 F (37.2 C) 98.6 F (37 C)  SpO2: 100% 100%    General: Awake alert coherent no distress slept well states pain is about 4/10 with minimal movement but becomes more severe when she stands Cardiovascular: S1-S2 no murmur rub or gallop Respiratory: Chest is clinically clear no added sound no rales or rhonchi  abd soft nt nd  Discharge Instructions   Discharge Instructions    Diet - low sodium heart healthy   Complete by: As directed    Discharge instructions   Complete by: As directed    No alcohol Up slowly and bear weight as you can tolerate Take tylenol 1000 mg 3 x a day fuirst choice pain Then ibuprofen  muscle relaxant ONLY if  spasm oob slowly  See your regular Md in a couple of weeks as you can afford   Increase activity slowly   Complete by: As directed      Allergies as of 08/14/2018      Reactions   Acetaminophen Other (See Comments)   Patient stated, " I overdosed on it."   Compazine Other (See Comments)    Convulsions/shaking and couldn't swallow anything      Medication List    STOP taking these medications   HYDROcodone-acetaminophen 5-325 MG tablet Commonly known as: NORCO/VICODIN   LORazepam 1 MG tablet Commonly known as: ATIVAN   oxyCODONE 5 MG immediate release tablet Commonly known as: Roxicodone     TAKE these medications   aspirin 325 MG tablet Take 325 mg by mouth every 6 (six) hours as needed for mild pain or headache.   ibuprofen 800 MG tablet Commonly known as: ADVIL Take 1 tablet (800 mg total) by mouth 4 (four) times daily. What changed:   medication strength  how much to take  when to take this  reasons to take this   methocarbamol 500 MG tablet Commonly known as: ROBAXIN Take 1 tablet (500 mg total) by mouth 4 (four) times daily.   multivitamin with minerals Tabs tablet Take 1 tablet by mouth daily.   vitamin B-12 1000 MCG tablet Commonly known as: CYANOCOBALAMIN Take 1,000 mcg by mouth daily.      Allergies  Allergen Reactions  . Acetaminophen Other (See Comments)    Patient stated, " I overdosed on it."  . Compazine Other (See Comments)    Convulsions/shaking and couldn't swallow anything      The results of significant diagnostics from this hospitalization (including imaging, microbiology, ancillary and laboratory) are listed below for reference.    Significant Diagnostic Studies: Ct Pelvis Wo Contrast  Result Date: 08/12/2018 CLINICAL DATA:  Mechanical fall with hip and groin pain EXAM: CT PELVIS WITHOUT CONTRAST TECHNIQUE: Multidetector CT imaging of the pelvis was performed following the standard protocol without intravenous contrast. COMPARISON:  08/12/2018 FINDINGS: Urinary Tract: Slightly thick-walled appearance of the urinary bladder, incompletely distended. Bowel:  Unremarkable visualized pelvic bowel loops. Vascular/Lymphatic: Moderate aortic atherosclerosis without aneurysm. No significantly enlarged lymph nodes Reproductive:  Moderate fluid within the vagina. Uterus otherwise unremarkable. No adnexal mass Other:  No significant pelvic effusion Musculoskeletal: Acute nondisplaced left inferior pubic ramus fracture. Acute mildly displaced left superior pubic ramus fracture best seen on coronal views. Fracture lucency extends to the left pubic symphysis but there is no widening of the symphysis. Acute, mildly comminuted left sacral ala fracture without widening of the left SI joint. Both proximal femurs appear intact. IMPRESSION: 1. Acute nondisplaced left inferior pubic ramus fracture and acute mildly displaced superior pubic ramus fracture with extension of fracture lucency to the pubic symphysis but no widening of the symphysis 2. Acute mildly comminuted left sacral ala fracture 3. Thick-walled appearance of urinary bladder, either due to under distension or possible cystitis 4. Moderate fluid in the vagina. Recommend correlation with direct inspection Electronically Signed   By: Jasmine PangKim  Fujinaga M.D.   On: 08/12/2018 20:42   Dg Hip Unilat W Or Wo Pelvis 2-3 Views Left  Result Date: 08/12/2018 CLINICAL DATA:  Fall, hip pain EXAM: DG HIP (WITH OR WITHOUT PELVIS) 2-3V LEFT COMPARISON:  None. FINDINGS: Acute mildly displaced fracture involving the left superior pubic ramus. Possible acute nondisplaced inferior pubic ramus fracture. Left femoral head demonstrates normal alignment. Pubic symphysis is intact. IMPRESSION:  Acute mildly displaced left superior pubic ramus fracture with possible acute nondisplaced fracture left inferior pubic ramus Electronically Signed   By: Jasmine PangKim  Fujinaga M.D.   On: 08/12/2018 02:56    Microbiology: Recent Results (from the past 240 hour(s))  SARS Coronavirus 2 (CEPHEID - Performed [facility]), Hosp Order     Status: None   Collection Time: 08/12/18  4:49 PM   Specimen: Nasopharyngeal Swab  Result Value Ref Range Status   SARS Coronavirus 2 NEGATIVE NEGATIVE Final    Comment: (NOTE) If  result is NEGATIVE SARS-CoV-2 target nucleic acids are NOT DETECTED. The SARS-CoV-2 RNA is generally detectable in upper and lower  respiratory specimens during the acute phase of infection. The lowest  concentration of SARS-CoV-2 viral copies this assay can detect is 250  copies / mL. A negative result does not preclude SARS-CoV-2 infection  and should not be used as the sole basis for treatment or other  patient management decisions.  A negative result may occur with  improper specimen collection / handling, submission of specimen other  than nasopharyngeal swab, presence of viral mutation(s) within the  areas targeted by this assay, and inadequate number of viral copies  (<250 copies / mL). A negative result must be combined with clinical  observations, patient history, and epidemiological information. If result is POSITIVE SARS-CoV-2 target nucleic acids are DETECTED. The SARS-CoV-2 RNA is generally detectable in upper and lower  respiratory specimens dur ing the acute phase of infection.  Positive  results are indicative of active infection with SARS-CoV-2.  Clinical  correlation with patient history and other diagnostic information is  necessary to determine patient infection status.  Positive results do  not rule out bacterial infection or co-infection with other viruses. If result is PRESUMPTIVE POSTIVE SARS-CoV-2 nucleic acids MAY BE PRESENT.   A presumptive positive result was obtained on the submitted specimen  and confirmed on repeat testing.  While 2019 novel coronavirus  (SARS-CoV-2) nucleic acids may be present in the submitted sample  additional confirmatory testing may be necessary for epidemiological  and / or clinical management purposes  to differentiate between  SARS-CoV-2 and other Sarbecovirus currently known to infect humans.  If clinically indicated additional testing with an alternate test  methodology [phone]([id number]) is advised. The SARS-CoV-2 RNA is generally   detectable in upper and lower respiratory sp ecimens during the acute  phase of infection. The expected result is Negative. Fact Sheet for Patients:  BoilerBrush.com.cyhttps://www.fda.gov/media/136312/download Fact Sheet for Healthcare Providers: https://pope.com/https://www.fda.gov/media/136313/download This test is not yet approved or cleared by the [place] FDA and has been authorized for detection and/or diagnosis of SARS-CoV-2 by FDA under an Emergency Use Authorization (EUA).  This EUA will remain in effect (meaning this test can be used) for the duration of the COVID-19 declaration under Section 564(b)(1) of the Act, 21 U.S.C. section 360bbb-3(b)(1), unless the authorization is terminated or revoked sooner. Performed at [facility], [address] [place], [place] [address]      Labs: Basic Metabolic Panel: Recent Labs  Lab 08/12/18 0154 08/13/18 0438 08/14/18 0536  NA 132* 138 137  K 3.9 4.0 4.1  CL 103 109 110  CO2 13* 20* 18*  GLUCOSE 94 118* 102*  BUN 6 6 <5*  CREATININE 0.95 0.89 0.76  CALCIUM 8.5* 8.2* 8.3*   Liver Function Tests: Recent Labs  Lab 08/12/18 0154  AST 26  ALT 16  ALKPHOS 106  BILITOT 0.8  PROT 6.4*  ALBUMIN 3.2*   No results for input(s): LIPASE, AMYLASE in the last 168 hours. No results for input(s): AMMONIA in the last 168 hours. CBC: Recent Labs  Lab 08/12/18 0154 08/13/18 0438 08/14/18 0536  WBC 12.9* 6.0 5.5  NEUTROABS 11.3*  --  3.9  HGB 13.9 12.6 11.5*  HCT 42.0 37.6 34.2*  MCV 108.0* 107.4* 106.2*  PLT 240 161 152   Cardiac Enzymes: No results for input(s): CKTOTAL, CKMB, CKMBINDEX, TROPONINI in the last 168 hours. BNP: BNP (last 3 results) No results for input(s): BNP in the last 8760 hours.  ProBNP (last 3 results) No results for input(s): PROBNP in the last 8760 hours.  CBG: No results for input(s): GLUCAP in the last 168 hours.     Signed:  Rhetta MuraJai-Gurmukh Enna Warwick MD   [facility] 08/14/2018, 9:23 AM

## 2018-08-14 NOTE — Progress Notes (Signed)
Occupational Therapy Treatment Patient Details Name: [name] MRN: [id number] DOB: [birth year] Today's Date: 08/14/2018    History of present illness 58 yo female with onset of fall at home resulted in L inferior and superior pubic ramus fractures, displaced superior fracture, L sacral ala fracture.  Pt is on CIWA protocol.  WBAT.  PMHx:  bipolar, anxiety, clotting disorder, cerebral hemorrhage, fibromyalgia, seizures, ortical sinus and venous sinus thrombosis, depresion, congenital single kidney   OT comments  Pt tolerated session well, however, limited by pain. Pt slowly progressing towards OT goals with limitations in functional mobility for ADL tasks. Pt demonstrated Mod I for bed mobility with increased time and effort. Min A required for simulated toilet transfer from EOB with 4 trials of sit to stand transition. Pt required assistance to power up, however, less assistance during 4th attempt. Pt will benefit from continued OT to ensure safe engagement in ADLs and safe transition to home environment prior to d/c. OT will continue to follow acutely.    Follow Up Recommendations  No OT follow up    Equipment Recommendations  None recommended by OT(Pt has appropriate DME)    Recommendations for Other Services      Precautions / Restrictions Precautions Precautions: Fall Precaution Comments: premedicate Restrictions Weight Bearing Restrictions: Yes LLE Weight Bearing: Weight bearing as tolerated       Mobility Bed Mobility Overal bed mobility: Needs Assistance Bed Mobility: Supine to Sit;Sit to Supine     Supine to sit: HOB elevated;Modified independent (Device/Increase time)(pt sits upright in bed at rest) Sit to supine: HOB elevated;Modified independent (Device/Increase time)(pt sits upright in bed )   General bed mobility comments: pt insisted on moving herself, used UE's to assist legs onto bed. Reports this is how she does it at home.  Transfers Overall transfer  level: Needs assistance Equipment used: None Transfers: Sit to/from Stand Sit to Stand: Min assist         General transfer comment: Pt tolerated sit to stand transition, however, any movement or weight provided to LLE caused increased pain limited a pivot transfer.    Balance Overall balance assessment: History of Falls;Needs assistance Sitting-balance support: Feet supported Sitting balance-Leahy Scale: Fair     Standing balance support: Bilateral upper extremity supported;During functional activity Standing balance-Leahy Scale: Poor                             ADL either performed or assessed with clinical judgement   ADL Overall ADL's : Needs assistance/impaired     Grooming: Set up;Sitting;Wash/dry hands;Wash/dry face;Oral care Grooming Details (indicate cue type and reason): sitting EOB                 Toilet Transfer: Min Administratorguard;RW Toilet Transfer Details (indicate cue type and reason): simulated toilet transfer from EOB with 4 attempts with sit to stand transition.         Functional mobility during ADLs: Min guard(SPT only) General ADL Comments: Pt reports feeling less pain during 4th attempt of sit to stand transition during simulated toilet transfer. Pt report feeling increased movement with less stiffness.      Vision       Perception     Praxis      Cognition Arousal/Alertness: Awake/alert Behavior During Therapy: Anxious Overall Cognitive Status: Within Functional Limits for tasks assessed  General Comments: pt is anxious to go home but cannot mobilize well and is unclear about the exact amount of help she has        Exercises Exercises: (LE strength was 3/5 R hip and 2+ L hip due to pain)   Shoulder Instructions       General Comments pt limited due to pain to engage in sink level ADL with BSC available.    Pertinent Vitals/ Pain       Pain Assessment: Faces Faces Pain  Scale: Hurts even more Pain Location: L hip Pain Descriptors / Indicators: Grimacing Pain Intervention(s): Limited activity within patient's tolerance;Monitored during session;Repositioned;Premedicated before session  Home Living                                          Prior Functioning/Environment              Frequency  Min 2X/week        Progress Toward Goals  OT Goals(current goals can now be found in the care plan section)  Progress towards OT goals: Progressing toward goals  Acute Rehab OT Goals Patient Stated Goal: to get home and just call people as needed OT Goal Formulation: With patient Time For Goal Achievement: 08/27/18 Potential to Achieve Goals: Good ADL Goals Pt Will Perform Lower Body Bathing: with modified independence;sitting/lateral leans;with adaptive equipment Pt Will Perform Lower Body Dressing: with modified independence;sitting/lateral leans;with adaptive equipment Pt Will Transfer to Toilet: with modified independence;stand pivot transfer;bedside commode Pt Will Perform Toileting - Clothing Manipulation and hygiene: with modified independence;sitting/lateral leans Additional ADL Goal #1: Pt will perform bed mobility at mod I level prior to engaging in ADL  Plan Discharge plan remains appropriate;Frequency remains appropriate    Co-evaluation                 AM-PAC OT "6 Clicks" Daily Activity     Outcome Measure   Help from another person eating meals?: None Help from another person taking care of personal grooming?: A Little Help from another person toileting, which includes using toliet, bedpan, or urinal?: A Little Help from another person bathing (including washing, rinsing, drying)?: A Little Help from another person to put on and taking off regular upper body clothing?: A Little Help from another person to put on and taking off regular lower body clothing?: A Little 6 Click Score: 19    End of Session  Equipment Utilized During Treatment: Gait belt;Rolling walker  OT Visit Diagnosis: Unsteadiness on feet (R26.81);Other abnormalities of gait and mobility (R26.89);Repeated falls (R29.6);History of falling (Z91.81);Muscle weakness (generalized) (M62.81);Pain Pain - Right/Left: Left Pain - part of body: Hip(pelvis)   Activity Tolerance Patient limited by pain   Patient Left in bed;with call bell/phone within reach;with bed alarm set   Nurse Communication Mobility status        Time: 3235-5732 OT Time Calculation (min): 28 min  Charges: OT General Charges $OT Visit: 1 Visit OT Treatments $Self Care/Home Management : 23-37 mins  [name], MSOT, OTR/L  [facility] Rehabilitation Services  [phone]    Marius Ditch 08/14/2018, 10:36 AM

## 2018-12-13 ENCOUNTER — Other Ambulatory Visit: Payer: Self-pay

## 2018-12-13 ENCOUNTER — Telehealth (INDEPENDENT_AMBULATORY_CARE_PROVIDER_SITE_OTHER): Payer: Self-pay | Admitting: Family Medicine

## 2018-12-13 ENCOUNTER — Encounter: Payer: Self-pay | Admitting: Family Medicine

## 2018-12-13 ENCOUNTER — Telehealth: Payer: Self-pay | Admitting: *Deleted

## 2018-12-13 VITALS — Temp 99.5°F

## 2018-12-13 DIAGNOSIS — R197 Diarrhea, unspecified: Secondary | ICD-10-CM

## 2018-12-13 DIAGNOSIS — R509 Fever, unspecified: Secondary | ICD-10-CM

## 2018-12-13 DIAGNOSIS — J989 Respiratory disorder, unspecified: Secondary | ICD-10-CM

## 2018-12-13 NOTE — Progress Notes (Signed)
Virtual Visit via Telephone Note  I connected with [name] on 12/13/18 at 10:20 AM EST by telephone and verified that I am speaking with the correct person using two identifiers.   I discussed the limitations, risks, security and privacy concerns of performing an evaluation and management service by telephone and the availability of in person appointments. I also discussed with the patient that there may be a patient responsible charge related to this service. The patient expressed understanding and agreed to proceed.  Location patient: home Location provider: work or home office Participants present for the call: patient, provider Patient did not have a visit in the prior 7 days to address this/these issue(s).   History of Present Illness:   Acute visit for feeling sick: -started about 3 days ago -she had a stopped up R ear - this cleared with hydrogen peroxide drops, then she had loss of taste, nasal congestion, nausea, she had a fever of 102 one day, fatigue, loss of appetite, mild cough, bodyaches, headaches, some diarrhea -denies any persistent fever (resolved now), SOB, inability to get out of bed, inability to intake fluids -fevers resolved today and she reports she is feeling better -she went to the dental office and saw a friend last week - no masks with friends, friend has not been sick that she is aware of  Observations/Objective: Patient sounds cheerful and well on the phone. I do not appreciate any SOB. Speech and thought processing are grossly intact. Patient reported vitals:  Assessment and Plan:  No diagnosis found.  -we discussed possible serious and likely etiologies, options for evaluation and workup, limitations of telemedicine visit vs in person visit, treatment, treatment risks and precautions. Pt prefers to treat via telemedicine empirically rather then risking or undertaking an in person visit at this moment. Suspect viral illness, mild influenza, COVID19  vs other. Since she feels she is doing  better opted for symptomatic home care.  She agrees to home isolation. Declined COVID19 testing, but she agrees to seek prompt in person care if worsening, new symptoms arise, or if is not improving with treatment. Follow up Monday with PCP for check in and for anxiety - she is requesting maybe getting back on benzo, she agrees to see PCP for this.. Follow Up Instructions:  I did not refer this patient for an OV in the next 24 hours for this/these issue(s).  I discussed the assessment and treatment plan with the patient. The patient was provided an opportunity to ask questions and all were answered. The patient agreed with the plan and demonstrated an understanding of the instructions.   The patient was advised to call back or seek an in-person evaluation if the symptoms worsen or if the condition fails to improve as anticipated.  I provided 13 minutes of non-face-to-face time during this encounter.   Terri Kern, DO

## 2018-12-13 NOTE — Telephone Encounter (Signed)
Patient requests a refill on Alprazolam.  Message sent to Dr Barbie Banner asst.

## 2018-12-13 NOTE — Telephone Encounter (Signed)
I do not see this on pt med list  Please advise

## 2018-12-14 NOTE — Telephone Encounter (Signed)
She will need a virtual OV for this. I have not seen her in over 3 years

## 2018-12-17 NOTE — Telephone Encounter (Signed)
Called pt 2x no answer or vm set up

## 2018-12-19 NOTE — Telephone Encounter (Signed)
Called pt again no answer or Vm. Closing note.

## 2019-02-25 ENCOUNTER — Telehealth: Payer: Self-pay | Admitting: *Deleted

## 2019-02-25 NOTE — Telephone Encounter (Signed)
Patient reports her right ear has been itching since "February" and ear pain drops/wax removers aren't helping. Caller reports the Pharmacist said she may have a bacterial infection in her ear.

## 2019-02-26 NOTE — Telephone Encounter (Signed)
ATC, Unable to leave a voicemail. CRM created.  As long as the patient is not having any covid symptoms she will need an in office appointment.

## 2019-03-05 NOTE — Telephone Encounter (Signed)
ATC 

## 2019-03-08 NOTE — Telephone Encounter (Signed)
Unable to reach the patient. Message will be closed.  

## 2019-06-04 ENCOUNTER — Observation Stay (HOSPITAL_COMMUNITY): Payer: Self-pay | Admitting: Certified Registered"

## 2019-06-04 ENCOUNTER — Encounter (HOSPITAL_COMMUNITY)
Admission: EM | Disposition: A | Discharge status: Home / Self Care | Payer: Self-pay | Source: Home / Self Care | Attending: Emergency Medicine

## 2019-06-04 ENCOUNTER — Observation Stay (HOSPITAL_COMMUNITY)
Admission: EM | Admit: 2019-06-04 | Discharge: 2019-06-05 | Disposition: A | Discharge status: Home / Self Care | Payer: Self-pay | Attending: Internal Medicine | Admitting: Internal Medicine

## 2019-06-04 ENCOUNTER — Encounter (HOSPITAL_COMMUNITY): Payer: Self-pay | Admitting: Emergency Medicine

## 2019-06-04 ENCOUNTER — Emergency Department (HOSPITAL_COMMUNITY): Payer: Self-pay

## 2019-06-04 ENCOUNTER — Other Ambulatory Visit: Payer: Self-pay

## 2019-06-04 DIAGNOSIS — W19XXXA Unspecified fall, initial encounter: Secondary | ICD-10-CM | POA: Insufficient documentation

## 2019-06-04 DIAGNOSIS — S52691A Other fracture of lower end of right ulna, initial encounter for closed fracture: Secondary | ICD-10-CM

## 2019-06-04 DIAGNOSIS — Z720 Tobacco use: Secondary | ICD-10-CM | POA: Diagnosis present

## 2019-06-04 DIAGNOSIS — Y92009 Unspecified place in unspecified non-institutional (private) residence as the place of occurrence of the external cause: Secondary | ICD-10-CM | POA: Insufficient documentation

## 2019-06-04 DIAGNOSIS — Q600 Renal agenesis, unilateral: Secondary | ICD-10-CM | POA: Insufficient documentation

## 2019-06-04 DIAGNOSIS — F17210 Nicotine dependence, cigarettes, uncomplicated: Secondary | ICD-10-CM | POA: Insufficient documentation

## 2019-06-04 DIAGNOSIS — S62101A Fracture of unspecified carpal bone, right wrist, initial encounter for closed fracture: Secondary | ICD-10-CM | POA: Diagnosis present

## 2019-06-04 DIAGNOSIS — Y9389 Activity, other specified: Secondary | ICD-10-CM | POA: Insufficient documentation

## 2019-06-04 DIAGNOSIS — S52601A Unspecified fracture of lower end of right ulna, initial encounter for closed fracture: Secondary | ICD-10-CM | POA: Insufficient documentation

## 2019-06-04 DIAGNOSIS — N179 Acute kidney failure, unspecified: Secondary | ICD-10-CM

## 2019-06-04 DIAGNOSIS — S52591A Other fractures of lower end of right radius, initial encounter for closed fracture: Secondary | ICD-10-CM

## 2019-06-04 DIAGNOSIS — F1010 Alcohol abuse, uncomplicated: Secondary | ICD-10-CM | POA: Insufficient documentation

## 2019-06-04 DIAGNOSIS — Z20822 Contact with and (suspected) exposure to covid-19: Secondary | ICD-10-CM | POA: Insufficient documentation

## 2019-06-04 DIAGNOSIS — Z86718 Personal history of other venous thrombosis and embolism: Secondary | ICD-10-CM | POA: Insufficient documentation

## 2019-06-04 DIAGNOSIS — R739 Hyperglycemia, unspecified: Secondary | ICD-10-CM | POA: Insufficient documentation

## 2019-06-04 DIAGNOSIS — D689 Coagulation defect, unspecified: Secondary | ICD-10-CM | POA: Insufficient documentation

## 2019-06-04 DIAGNOSIS — S52571A Other intraarticular fracture of lower end of right radius, initial encounter for closed fracture: Principal | ICD-10-CM | POA: Insufficient documentation

## 2019-06-04 DIAGNOSIS — R269 Unspecified abnormalities of gait and mobility: Secondary | ICD-10-CM | POA: Insufficient documentation

## 2019-06-04 HISTORY — PX: ORIF WRIST FRACTURE: SHX2133

## 2019-06-04 LAB — CBC WITH DIFFERENTIAL/PLATELET
Abs Immature Granulocytes: 0.04 10*3/uL (ref 0.00–0.07)
Basophils Absolute: 0 10*3/uL (ref 0.0–0.1)
Basophils Relative: 0 %
Eosinophils Absolute: 0 10*3/uL (ref 0.0–0.5)
Eosinophils Relative: 0 %
HCT: 46.3 % — ABNORMAL HIGH (ref 36.0–46.0)
Hemoglobin: 15.5 g/dL — ABNORMAL HIGH (ref 12.0–15.0)
Immature Granulocytes: 0 %
Lymphocytes Relative: 8 %
Lymphs Abs: 0.8 10*3/uL (ref 0.7–4.0)
MCH: 35.2 pg — ABNORMAL HIGH (ref 26.0–34.0)
MCHC: 33.5 g/dL (ref 30.0–36.0)
MCV: 105.2 fL — ABNORMAL HIGH (ref 80.0–100.0)
Monocytes Absolute: 0.8 10*3/uL (ref 0.1–1.0)
Monocytes Relative: 8 %
Neutro Abs: 8.8 10*3/uL — ABNORMAL HIGH (ref 1.7–7.7)
Neutrophils Relative %: 84 %
Platelets: 171 10*3/uL (ref 150–400)
RBC: 4.4 MIL/uL (ref 3.87–5.11)
RDW: 15.5 % (ref 11.5–15.5)
WBC: 10.5 10*3/uL (ref 4.0–10.5)
nRBC: 0 % (ref 0.0–0.2)

## 2019-06-04 LAB — BASIC METABOLIC PANEL
Anion gap: 12 (ref 5–15)
BUN: 10 mg/dL (ref 6–20)
CO2: 23 mmol/L (ref 22–32)
Calcium: 9.2 mg/dL (ref 8.9–10.3)
Chloride: 101 mmol/L (ref 98–111)
Creatinine, Ser: 1.13 mg/dL — ABNORMAL HIGH (ref 0.44–1.00)
GFR calc Af Amer: >60 mL/min (ref >60)
GFR calc non Af Amer: 54 mL/min — ABNORMAL LOW (ref >60)
Glucose, Bld: 127 mg/dL — ABNORMAL HIGH (ref 70–99)
Potassium: 3.7 mmol/L (ref 3.5–5.1)
Sodium: 136 mmol/L (ref 135–145)

## 2019-06-04 LAB — I-STAT CHEM 8, ED
BUN: 13 mg/dL (ref 6–20)
Calcium, Ion: 1.12 mmol/L — ABNORMAL LOW (ref 1.15–1.40)
Chloride: 103 mmol/L (ref 98–111)
Creatinine, Ser: 0.9 mg/dL (ref 0.44–1.00)
Glucose, Bld: 105 mg/dL — ABNORMAL HIGH (ref 70–99)
HCT: 43 % (ref 36.0–46.0)
Hemoglobin: 14.6 g/dL (ref 12.0–15.0)
Potassium: 4.6 mmol/L (ref 3.5–5.1)
Sodium: 135 mmol/L (ref 135–145)
TCO2: 26 mmol/L (ref 22–32)

## 2019-06-04 LAB — RESPIRATORY PANEL BY RT PCR (FLU A&B, COVID)
Influenza A by PCR: NEGATIVE
Influenza B by PCR: NEGATIVE
SARS Coronavirus 2 by RT PCR: NEGATIVE

## 2019-06-04 LAB — ETHANOL: Alcohol, Ethyl (B): <10 mg/dL (ref <10)

## 2019-06-04 SURGERY — OPEN REDUCTION INTERNAL FIXATION (ORIF) WRIST FRACTURE
Anesthesia: Regional | Site: Wrist | Laterality: Right

## 2019-06-04 MED ORDER — METHOCARBAMOL 500 MG PO TABS
500.0000 mg | ORAL_TABLET | Freq: Four times a day (QID) | ORAL | Status: DC | PRN
  Administered 2019-06-05 (×2): 500 mg via ORAL
  Filled 2019-06-04 (×3): qty 1

## 2019-06-04 MED ORDER — CEFAZOLIN SODIUM-DEXTROSE 2-3 GM-%(50ML) IV SOLR
INTRAVENOUS | Status: DC | PRN
Start: 2019-06-04 — End: 2019-06-04
  Administered 2019-06-04: 2 g via INTRAVENOUS

## 2019-06-04 MED ORDER — PROPOFOL 500 MG/50ML IV EMUL
INTRAVENOUS | Status: DC | PRN
  Administered 2019-06-04: 150 ug/kg/min via INTRAVENOUS

## 2019-06-04 MED ORDER — LORAZEPAM 2 MG/ML IJ SOLN
0.0000 mg | Freq: Four times a day (QID) | INTRAMUSCULAR | Status: DC
  Administered 2019-06-04: 1 mg via INTRAVENOUS
  Filled 2019-06-04: qty 1

## 2019-06-04 MED ORDER — PROPOFOL 10 MG/ML IV BOLUS
INTRAVENOUS | Status: AC
  Filled 2019-06-04: qty 20

## 2019-06-04 MED ORDER — ENOXAPARIN SODIUM 30 MG/0.3ML ~~LOC~~ SOLN: 30.0000 mg | SUBCUTANEOUS | Status: DC

## 2019-06-04 MED ORDER — LORAZEPAM 2 MG/ML IJ SOLN: 0.0000 mg | Freq: Two times a day (BID) | INTRAMUSCULAR | Status: DC

## 2019-06-04 MED ORDER — CEFAZOLIN SODIUM-DEXTROSE 1-4 GM/50ML-% IV SOLN
1.0000 g | Freq: Three times a day (TID) | INTRAVENOUS | Status: DC
  Administered 2019-06-05: 02:00:00 1 g via INTRAVENOUS
  Filled 2019-06-04 (×2): qty 50

## 2019-06-04 MED ORDER — OXYCODONE HCL 5 MG PO TABS
10.0000 mg | ORAL_TABLET | ORAL | Status: DC | PRN
  Administered 2019-06-05 (×2): 10 mg via ORAL
  Filled 2019-06-04 (×2): qty 2

## 2019-06-04 MED ORDER — OXYCODONE HCL 5 MG PO TABS: 5.0000 mg | ORAL_TABLET | Freq: Once | ORAL | Status: DC | PRN

## 2019-06-04 MED ORDER — MORPHINE SULFATE (PF) 2 MG/ML IV SOLN
4.0000 mg | Freq: Once | INTRAVENOUS | Status: DC
  Filled 2019-06-04: qty 2

## 2019-06-04 MED ORDER — SODIUM CHLORIDE 0.9 % IR SOLN
Status: DC | PRN
  Administered 2019-06-04: 1000 mL

## 2019-06-04 MED ORDER — FENTANYL CITRATE (PF) 100 MCG/2ML IJ SOLN: 25.0000 ug | INTRAMUSCULAR | Status: DC | PRN

## 2019-06-04 MED ORDER — OXYCODONE HCL 5 MG PO TABS
5.0000 mg | ORAL_TABLET | Freq: Once | ORAL | Status: AC
  Administered 2019-06-04: 14:00:00 5 mg via ORAL
  Filled 2019-06-04: qty 1

## 2019-06-04 MED ORDER — SODIUM CHLORIDE 0.9 % IV BOLUS: 1000.0000 mL | Freq: Once | INTRAVENOUS | Status: DC

## 2019-06-04 MED ORDER — THIAMINE HCL 100 MG PO TABS
100.0000 mg | ORAL_TABLET | Freq: Every day | ORAL | Status: DC
  Administered 2019-06-05: 11:00:00 100 mg via ORAL
  Filled 2019-06-04: qty 1

## 2019-06-04 MED ORDER — OXYCODONE HCL 5 MG/5ML PO SOLN: 5.0000 mg | Freq: Once | ORAL | Status: DC | PRN

## 2019-06-04 MED ORDER — FOLIC ACID 1 MG PO TABS
1.0000 mg | ORAL_TABLET | Freq: Every day | ORAL | Status: DC
  Administered 2019-06-05: 1 mg via ORAL
  Filled 2019-06-04: qty 1

## 2019-06-04 MED ORDER — THIAMINE HCL 100 MG/ML IJ SOLN
100.0000 mg | Freq: Every day | INTRAMUSCULAR | Status: DC
  Administered 2019-06-04: 16:00:00 100 mg via INTRAVENOUS
  Filled 2019-06-04: qty 2

## 2019-06-04 MED ORDER — FENTANYL CITRATE (PF) 100 MCG/2ML IJ SOLN
INTRAMUSCULAR | Status: AC
  Filled 2019-06-04: qty 2

## 2019-06-04 MED ORDER — LORAZEPAM 2 MG/ML IJ SOLN: 1.0000 mg | INTRAMUSCULAR | Status: DC | PRN

## 2019-06-04 MED ORDER — MIDAZOLAM HCL 2 MG/2ML IJ SOLN
INTRAMUSCULAR | Status: AC
  Filled 2019-06-04: qty 2

## 2019-06-04 MED ORDER — ADULT MULTIVITAMIN W/MINERALS CH
1.0000 | ORAL_TABLET | Freq: Every day | ORAL | Status: DC
  Administered 2019-06-05: 11:00:00 1 via ORAL
  Filled 2019-06-04: qty 1

## 2019-06-04 MED ORDER — ONDANSETRON HCL 4 MG/2ML IJ SOLN
4.0000 mg | Freq: Once | INTRAMUSCULAR | Status: AC
  Administered 2019-06-04: 4 mg via INTRAVENOUS
  Filled 2019-06-04: qty 2

## 2019-06-04 MED ORDER — PHENYLEPHRINE HCL (PRESSORS) 10 MG/ML IV SOLN
INTRAVENOUS | Status: DC | PRN
Start: 2019-06-04 — End: 2019-06-04
  Administered 2019-06-04: 80 ug via INTRAVENOUS

## 2019-06-04 MED ORDER — FENTANYL CITRATE (PF) 100 MCG/2ML IJ SOLN
50.0000 ug | Freq: Once | INTRAMUSCULAR | Status: AC
  Administered 2019-06-04: 50 ug via INTRAVENOUS
  Filled 2019-06-04: qty 2

## 2019-06-04 MED ORDER — ONDANSETRON HCL 4 MG/2ML IJ SOLN: 4.0000 mg | Freq: Once | INTRAMUSCULAR | Status: DC | PRN

## 2019-06-04 MED ORDER — CEFAZOLIN SODIUM-DEXTROSE 2-4 GM/100ML-% IV SOLN
INTRAVENOUS | Status: AC
  Filled 2019-06-04: qty 100

## 2019-06-04 MED ORDER — ONDANSETRON HCL 4 MG/2ML IJ SOLN
INTRAMUSCULAR | Status: DC | PRN
  Administered 2019-06-04: 4 mg via INTRAVENOUS

## 2019-06-04 MED ORDER — ONDANSETRON HCL 4 MG PO TABS: 4.0000 mg | ORAL_TABLET | Freq: Four times a day (QID) | ORAL | Status: DC | PRN

## 2019-06-04 MED ORDER — ONDANSETRON HCL 4 MG/2ML IJ SOLN: 4.0000 mg | Freq: Four times a day (QID) | INTRAMUSCULAR | Status: DC | PRN

## 2019-06-04 MED ORDER — LORAZEPAM 1 MG PO TABS: 0.0000 mg | ORAL_TABLET | Freq: Two times a day (BID) | ORAL | Status: DC

## 2019-06-04 MED ORDER — LACTATED RINGERS IV SOLN: INTRAVENOUS | Status: DC | PRN

## 2019-06-04 MED ORDER — BUPIVACAINE-EPINEPHRINE (PF) 0.5% -1:200000 IJ SOLN
INTRAMUSCULAR | Status: DC | PRN
Start: 2019-06-04 — End: 2019-06-04
  Administered 2019-06-04: 20 mL

## 2019-06-04 MED ORDER — MORPHINE SULFATE (PF) 2 MG/ML IV SOLN
2.0000 mg | INTRAVENOUS | Status: DC | PRN
  Administered 2019-06-05: 08:00:00 2 mg via INTRAVENOUS
  Filled 2019-06-04: qty 1

## 2019-06-04 MED ORDER — FENTANYL CITRATE (PF) 100 MCG/2ML IJ SOLN
INTRAMUSCULAR | Status: DC | PRN
  Administered 2019-06-04: 50 ug via INTRAVENOUS

## 2019-06-04 MED ORDER — NICOTINE 21 MG/24HR TD PT24
21.0000 mg | MEDICATED_PATCH | Freq: Every day | TRANSDERMAL | Status: DC
  Administered 2019-06-05: 11:00:00 21 mg via TRANSDERMAL
  Filled 2019-06-04: qty 1

## 2019-06-04 MED ORDER — ALBUTEROL SULFATE (2.5 MG/3ML) 0.083% IN NEBU: 2.5000 mg | INHALATION_SOLUTION | Freq: Four times a day (QID) | RESPIRATORY_TRACT | Status: DC | PRN

## 2019-06-04 MED ORDER — LORAZEPAM 1 MG PO TABS
0.0000 mg | ORAL_TABLET | Freq: Four times a day (QID) | ORAL | Status: DC
  Administered 2019-06-05 (×2): 1 mg via ORAL
  Filled 2019-06-04 (×2): qty 1

## 2019-06-04 MED ORDER — FENTANYL CITRATE (PF) 250 MCG/5ML IJ SOLN
INTRAMUSCULAR | Status: AC
  Filled 2019-06-04: qty 5

## 2019-06-04 MED ORDER — SODIUM CHLORIDE 0.9% FLUSH
3.0000 mL | Freq: Two times a day (BID) | INTRAVENOUS | Status: DC
  Administered 2019-06-05: 11:00:00 3 mL via INTRAVENOUS

## 2019-06-04 MED ORDER — MIDAZOLAM HCL 5 MG/5ML IJ SOLN
INTRAMUSCULAR | Status: DC | PRN
  Administered 2019-06-04: 2 mg via INTRAVENOUS

## 2019-06-04 MED ORDER — OXYCODONE HCL 5 MG PO TABS: 5.0000 mg | ORAL_TABLET | Freq: Four times a day (QID) | ORAL | Status: DC | PRN

## 2019-06-04 MED ORDER — LORAZEPAM 1 MG PO TABS: 1.0000 mg | ORAL_TABLET | ORAL | Status: DC | PRN

## 2019-06-04 SURGICAL SUPPLY — 53 items
BIT DRILL 2.2 SS TIBIAL (BIT) ×2 IMPLANT
BLADE CLIPPER SURG (BLADE) IMPLANT
BNDG CMPR 9X4 STRL LF SNTH (GAUZE/BANDAGES/DRESSINGS) ×1
BNDG ELASTIC 3X5.8 VLCR STR LF (GAUZE/BANDAGES/DRESSINGS) ×3 IMPLANT
BNDG ELASTIC 4X5.8 VLCR STR LF (GAUZE/BANDAGES/DRESSINGS) ×3 IMPLANT
BNDG ESMARK 4X9 LF (GAUZE/BANDAGES/DRESSINGS) ×3 IMPLANT
BNDG GAUZE ELAST 4 BULKY (GAUZE/BANDAGES/DRESSINGS) ×3 IMPLANT
CANISTER SUCT 3000ML PPV (MISCELLANEOUS) ×3 IMPLANT
CORD BIPOLAR FORCEPS 12FT (ELECTRODE) ×3 IMPLANT
COVER SURGICAL LIGHT HANDLE (MISCELLANEOUS) ×3 IMPLANT
COVER WAND RF STERILE (DRAPES) ×3 IMPLANT
CUFF TOURN SGL QUICK 18X4 (TOURNIQUET CUFF) ×3 IMPLANT
CUFF TOURN SGL QUICK 24 (TOURNIQUET CUFF)
CUFF TRNQT CYL 24X4X16.5-23 (TOURNIQUET CUFF) IMPLANT
DRAPE OEC MINIVIEW 54X84 (DRAPES) ×3 IMPLANT
DRAPE SURG 17X23 STRL (DRAPES) ×3 IMPLANT
GAUZE SPONGE 4X4 12PLY STRL (GAUZE/BANDAGES/DRESSINGS) ×3 IMPLANT
GAUZE XEROFORM 1X8 LF (GAUZE/BANDAGES/DRESSINGS) ×3 IMPLANT
GLOVE SS BIOGEL STRL SZ 8 (GLOVE) ×1 IMPLANT
GLOVE SUPERSENSE BIOGEL SZ 8 (GLOVE) ×2
GOWN STRL REUS W/ TWL LRG LVL3 (GOWN DISPOSABLE) ×1 IMPLANT
GOWN STRL REUS W/ TWL XL LVL3 (GOWN DISPOSABLE) ×1 IMPLANT
GOWN STRL REUS W/TWL LRG LVL3 (GOWN DISPOSABLE) ×3
GOWN STRL REUS W/TWL XL LVL3 (GOWN DISPOSABLE) ×3
KIT BASIN OR (CUSTOM PROCEDURE TRAY) ×3 IMPLANT
KIT TURNOVER KIT B (KITS) ×3 IMPLANT
LOOP VESSEL MAXI BLUE (MISCELLANEOUS) IMPLANT
NS IRRIG 1000ML POUR BTL (IV SOLUTION) ×3 IMPLANT
PACK ORTHO EXTREMITY (CUSTOM PROCEDURE TRAY) ×3 IMPLANT
PAD ARMBOARD 7.5X6 YLW CONV (MISCELLANEOUS) ×6 IMPLANT
PAD CAST 3X4 CTTN HI CHSV (CAST SUPPLIES) ×1 IMPLANT
PAD CAST 4YDX4 CTTN HI CHSV (CAST SUPPLIES) ×1 IMPLANT
PADDING CAST COTTON 3X4 STRL (CAST SUPPLIES) ×3
PADDING CAST COTTON 4X4 STRL (CAST SUPPLIES) ×3
PEG LOCKING SMOOTH 2.2X18 (Peg) ×4 IMPLANT
PEG LOCKING SMOOTH 2.2X20 (Screw) ×8 IMPLANT
PILLOW ARM CARTER ADULT (MISCELLANEOUS) ×2 IMPLANT
PLATE NARROW DVR RIGHT (Plate) ×2 IMPLANT
SCREW LOCK 12X2.7X 3 LD (Screw) IMPLANT
SCREW LOCK 14X2.7X 3 LD TPR (Screw) IMPLANT
SCREW LOCKING 2.7X12MM (Screw) ×9 IMPLANT
SCREW LOCKING 2.7X13MM (Screw) ×2 IMPLANT
SCREW LOCKING 2.7X14 (Screw) ×3 IMPLANT
SOL PREP POV-IOD 4OZ 10% (MISCELLANEOUS) ×6 IMPLANT
SPONGE LAP 4X18 RFD (DISPOSABLE) IMPLANT
SUT PROLENE 4 0 PS 2 18 (SUTURE) ×4 IMPLANT
SUT VIC AB 4-0 PS2 18 (SUTURE) ×2 IMPLANT
TOWEL GREEN STERILE (TOWEL DISPOSABLE) ×3 IMPLANT
TOWEL GREEN STERILE FF (TOWEL DISPOSABLE) ×3 IMPLANT
TUBE CONNECTING 12'X1/4 (SUCTIONS) ×1
TUBE CONNECTING 12X1/4 (SUCTIONS) ×2 IMPLANT
UNDERPAD 30X30 (UNDERPADS AND DIAPERS) ×3 IMPLANT
WATER STERILE IRR 1000ML POUR (IV SOLUTION) ×3 IMPLANT

## 2019-06-04 NOTE — Consult Note (Signed)
Reason for Consult:Right wrist fx Referring Physician: [name] is an 59 y.o. female.  HPI: [name] was at home and was trying to stand up using her RW for balance but had it turned backwards. She lost her balance and fell to the floor. She had immediate right wrist pain but did not seek care 2/2 lack of insurance. This happened 2d ago. Eventually the pain became bad enough that she came to the ED for evaluation. X-rays showed a wrist fx and orthopedic surgery was consulted. She is LHD and unemployed. She's been using a RW since a significant trauma last year.  Past Medical History:  Diagnosis Date  . Anal fissure   . Anxiety   . Bipolar 1 disorder (HCC)   . Cerebral hemorrhage (HCC)   . Clotting disorder (HCC)    hx of blood clot  . Congenital single kidney   . Depression   . Endometriosis   . Fibromyalgia   . Hemorrhoids   . History of elevated homocysteine   . Seizures (HCC) 03/18/15  . 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 APPENDECTOMY    . BREAST SURGERY     benign breast lump removal  . CESAREAN SECTION    . LAPAROSCOPY     endometriosis  . LEEP  2000  . TONSILLECTOMY    . TUBAL LIGATION      Family History  Problem Relation Age of Onset  . Heart disease Mother   . Cancer Sister        breast  . Heart disease Brother   . Coronary artery disease Other   . Stroke Other   . Hypertension Other     Social History:  reports that she has been smoking cigarettes. She has been smoking about 1.00 pack per day. She has never used smokeless tobacco. She reports current alcohol use of about 28.0 standard drinks of alcohol per week. She reports that she does not use drugs.  Allergies:  Allergies  Allergen Reactions  . Acetaminophen Other (See Comments)    Patient stated, " I overdosed on it."  . Compazine Other (See Comments)    Convulsions/shaking and couldn't swallow  anything    Medications: I have reviewed the patient's current medications.  No results found for this or any previous visit (from the past 48 hour(s)).  DG Hand 2 View Right  Result Date: 06/04/2019 CLINICAL DATA:  Fall EXAM: RIGHT HAND - 2 VIEW COMPARISON:  None. FINDINGS: Acute fracture of the distal right radius with dorsal displacement and angulation. No definite intra-articular extension. Additional acute fracture of the distal ulna with significant dorsal displacement. Soft tissue swelling is present. IMPRESSION: Acute fracture of the distal right radius with dorsal displacement and angulation. Acute fracture of the distal ulna with significant dorsal displacement. Electronically Signed   By: Guadlupe Spanish M.D.   On: 06/04/2019 13:18    Review of Systems  HENT: Negative for ear discharge, ear pain, hearing loss and tinnitus.   Eyes: Negative for photophobia and pain.  Respiratory: Negative for cough and shortness of breath.   Cardiovascular: Negative for chest pain.  Gastrointestinal: Negative for abdominal pain, nausea and vomiting.  Genitourinary: Negative for dysuria, flank pain, frequency and urgency.  Musculoskeletal: Positive for arthralgias (Right wrist). Negative for back pain, myalgias and neck pain.  Neurological: Negative for dizziness and headaches.  Hematological: Does not  bruise/bleed easily.  Psychiatric/Behavioral: The patient is not nervous/anxious.    Blood pressure (!) 124/94, pulse 98, temperature 98.9 F (37.2 C), temperature source Oral, resp. rate (!) 23, height 5\' 2"  (1.575 m), weight 43.5 kg, SpO2 97 %. Physical Exam  Constitutional: She appears well-developed and well-nourished. No distress.  HENT:  Head: Normocephalic and atraumatic.  Eyes: Conjunctivae are normal. Right eye exhibits no discharge. Left eye exhibits no discharge. No scleral icterus.  Cardiovascular: Normal rate and regular rhythm.  Respiratory: Effort normal. No respiratory distress.   Musculoskeletal:     Cervical back: Normal range of motion.     Comments: Right shoulder, elbow, wrist, digits- no skin wounds, severe TTP wrist, mod edema, no blocks to motion  Sens  Ax/R/M/U intact  Mot   Ax/ R/ PIN/ M/ AIN/ U intact  Rad 2+  Neurological: She is alert.  Skin: Skin is warm and dry. She is not diaphoretic.  Psychiatric: She has a normal mood and affect. Her behavior is normal.    Assessment/Plan: Right wrist fx -- Plan ORIF today by Dr. Amedeo Plenty. Please keep NPO. Multiple medical problems including bipolar d/o, hx/o blood clot, seizure d/o, and fibromyalgia -- She has not had treatment of these recently 2/2 lack of insurance. Will ask IM to admit and address to make sure we can optimize her chances of a successful recovery.    Lisette Abu, PA-C Orthopedic Surgery [phone] 06/04/2019, 2:52 PM

## 2019-06-04 NOTE — Op Note (Signed)
Operative note  Roseanne Kaufman MD  Preoperative diagnosis: right Distal radius fracture comminuted complex greater than 3 part intra-articular  Postop diagnosis: Same  Procedure: #1right  open reduction internal fixation comminuted complex distal radius fracture with DVR [employer] cross lock plate and screw construct.  This was a greater than 3 part intra-articular fracture.  #2 AP lateral and oblique x-rays performed examined and interpreted by myself  Savilla Turbyfill MD  Anesthesia:block with IV sedation  Estimated blood loss minimal  Complications none immediate  Operative indications the patient presents for evaluation and surgical care.  Patient understands risk benefits and desires to proceed.  We have discussed with the patient all issues plans and concerns with this in mind we will proceed accordingly. We are planning surgery for your upper extremity. The risk and benefits of surgery to include risk of bleeding, infection, anesthesia,  damage to normal structures and failure of the surgery to accomplish its intended goals of relieving symptoms and restoring function have been discussed in detail. With this in mind we plan to proceed. I have specifically discussed with the patient the pre-and postoperative regime and the dos and don'ts and risk and benefits in great detail. Risk and benefits of surgery also include risk of dystrophy(CRPS), chronic nerve pain, failure of the healing process to go onto completion and other inherent risks of surgery The relavent the pathophysiology of the disease/injury process, as well as the alternatives for treatment and postoperative course of action has been discussed in great detail with the patient who desires to proceed.  We will do everything in our power to help you (the patient) restore function to the upper extremity. It is a pleasure to see this patient today.    Operative procedure: Patient was seen by myself and anesthesia.  Appropriate anesthesia  was induced and following this the patient was prepped with a Hibiclens pre-scrub followed by 10-minute surgical Betadine scrub and paint.  Once this was completed the extremity was elevated and the tourniquet was insufflated to 250 mmHg.  Timeout was observed preoperative antibiotics were given and the patient then underwent a very careful and cautious approach to the extremity with volar radial incision under 250 mm tourniquet control.  FCR tendon sheath was identified and dissected.  There were no complicating features.  Once this was completed the carpal canal contents were retracted ulnarly and the FCR was retracted radially.  We took very meticulous care of the radial artery and the carpal canal contents during the approach.  The pronator was accessed incised and lifted off of the fracture.  The fracture was then reassembled with standard orthopedic equipment and a DVR plate and screw construct from [employer] - narrow plate- was accomplished in terms of placement and fixation of the fracture.  Adequate radial height, volar tilt and radial inclination was restored.  The distal radial ulnar joint, radiocarpal and midcarpal joints all were  stable and satisfactory.  We irrigated copiously and closed the pronator with 3-0 Vicryl followed by closure of the skin edge with Prolene.  Once again, the distal radius underwent open reduction internal fixation without complications.  The distal radial ulnar joint was stable.  The patient had no complications.  All radiographic parameters look quite well following the fixation.  Standard dressing of Adaptic Xeroform 4 x 4's gauze web roll Kerlix and a volar splint were applied.  The patient understands instructions of elevate move massage fingers notify us any problems occur and follow-up care according to our standard protocol for  a DVR plate and screw construct.   Dominica Severin MD

## 2019-06-04 NOTE — Anesthesia Procedure Notes (Signed)
Anesthesia Regional Block: Supraclavicular block   Pre-Anesthetic Checklist: ,, timeout performed, Correct Patient, Correct Site, Correct Laterality, Correct Procedure, Correct Position, site marked, Risks and benefits discussed,  Surgical consent,  Pre-op evaluation,  At surgeon's request and post-op pain management  Laterality: Right  Prep: chloraprep       Needles:  Injection technique: Single-shot  Needle Type: Echogenic Stimulator Needle     Needle Length: 9cm  Needle Gauge: 21     Additional Needles:   Procedures:,,,, ultrasound used (permanent image in chart),,,,  Narrative:  Start time: 06/04/2019 5:20 PM End time: 06/04/2019 5:30 PM Injection made incrementally with aspirations every 5 mL.  Performed by: Personally  Anesthesiologist: Shelton Silvas, MD  Additional Notes: Patient tolerated the procedure well. Local anesthetic introduced in an incremental fashion under minimal resistance after negative aspirations. No paresthesias were elicited. After completion of the procedure, no acute issues were identified and patient continued to be monitored by RN.

## 2019-06-04 NOTE — H&P (Signed)
History and Physical    [name] BMW:[id number] DOB: [birth year] DOA: 06/04/2019  Referring MD/NP/PA: Evlyn Kanner, PA-C PCP: Nelwyn Salisbury, MD  Patient coming from: Home via EMS  Chief Complaint: Fall  I have personally briefly reviewed patient's old medical records in [facility]   HPI: [name] is a 59 y.o. female with medical history significant of history of  bipolar disorder, alcohol abuse, fibromyalgia, remote seizure, congenital single kidney, tobacco abuse, and gait disturbance.  She utilizes a rolling walker at baseline.  At the time of the fall her walker was turned backwards and when she tried to pull the closer it toppled causing her to fall on to her right hand.  She reported having severe pain in her right wrist with swelling.  Any kind of movement worsens symptoms.  She had tried using ibuprofen without improvement in symptoms.  Denies having any recent headache, fevers, nausea, vomiting, shortness of breath, diarrhea, seizures, recent sick contacts, or issues with bleeding.  She notes a history of multiple blood clots, but then states she has had multiple blood clots causing intracranial hemorrhage.  Denies ever having any lower extremity blood clots.  Patient  normally drinks anywhere from 4-6 beers per day on average.  She has cut back to smoking only 1 pack of cigarettes per day on average currently.  ED Course: Upon admission into the emergency department patient was noted to be mildly tachypneic with blood pressure 124/94-156/86, and all other vital signs maintained.  Labs have not come back yet.  K.  Chest x-ray was otherwise noted to be clear.  X-rays of the right hand revealed distal radius and ulna fractures.  Orthopedics consulted and Dr. Amanda Pea plans to take to the operating room today.  Patient was given oxycodone, fentanyl, 1 L normal saline IV fluids, and started on CIWA protocol.  TRH called to admit.  Review of Systems  Constitutional: Negative  for chills, fever and malaise/fatigue.  HENT: Negative for congestion and nosebleeds.   Eyes: Negative for double vision and photophobia.  Respiratory: Negative for cough and shortness of breath.   Cardiovascular: Negative for palpitations and claudication.  Gastrointestinal: Negative for abdominal pain, nausea and vomiting.  Genitourinary: Negative for dysuria and hematuria.  Musculoskeletal: Positive for falls and myalgias.  Neurological: Positive for tremors. Negative for seizures.  Psychiatric/Behavioral: Positive for substance abuse. Negative for memory loss.    Past Medical History:  Diagnosis Date  . Anal fissure   . Anxiety   . Bipolar 1 disorder (HCC)   . Cerebral hemorrhage (HCC)   . Clotting disorder (HCC)    hx of blood clot  . Congenital single kidney   . Depression   . Endometriosis   . Fibromyalgia   . Hemorrhoids   . History of elevated homocysteine   . Seizures (HCC) 03/18/15  . 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 APPENDECTOMY    . BREAST SURGERY     benign breast lump removal  . CESAREAN SECTION    . LAPAROSCOPY     endometriosis  . LEEP  2000  . TONSILLECTOMY    . TUBAL LIGATION       reports that she has been smoking cigarettes. She has been smoking about 1.00 pack per day. She has never used smokeless tobacco. She reports current alcohol use of about 28.0 standard drinks of alcohol per week.  She reports that she does not use drugs.  Allergies  Allergen Reactions  . Acetaminophen Other (See Comments)    Patient stated, " I overdosed on it."  . Compazine Other (See Comments)    Convulsions/shaking and couldn't swallow anything    Family History  Problem Relation Age of Onset  . Heart disease Mother   . Cancer Sister        breast  . Heart disease Brother   . Coronary artery disease Other   . Stroke Other   . Hypertension Other     Prior to  Admission medications   Medication Sig Start Date End Date Taking? Authorizing Provider  aspirin 325 MG tablet Take 325 mg by mouth every 6 (six) hours as needed for mild pain or headache.     [provider]  Multiple Vitamin (MULTIVITAMIN WITH MINERALS) TABS tablet Take 1 tablet by mouth daily.    [provider]    Physical Exam:  Constitutional: Thin elderly appearing female Vitals:   06/04/19 1200 06/04/19 1524  BP: (!) 124/94 (!) 156/86  Pulse: 98 64  Resp: (!) 23   Temp: 98.9 F (37.2 C) 99 F (37.2 C)  TempSrc: Oral   SpO2: 97% 100%  Weight: 43.5 kg   Height: 5\' 2"  (1.575 m)    Eyes: PERRL, lids and conjunctivae normal ENMT: Mucous membranes are moist. Posterior pharynx clear of any exudate or lesions.  Neck: normal, supple, no masses, no thyromegaly Respiratory: clear to auscultation bilaterally, no wheezing, no crackles. Normal respiratory effort. No accessory muscle use.  Cardiovascular: Regular rate and rhythm, no murmurs / rubs / gallops. No extremity edema. 2+ pedal pulses. No carotid bruits.  Abdomen: no tenderness, no masses palpated. No hepatosplenomegaly. Bowel sounds positive.  Musculoskeletal: no clubbing / cyanosis.  Deformity of the right wrist with swelling appreciated. Skin: Bruising noted around the right wrist. Neurologic: CN 2-12 grossly intact.  Tremor present Psychiatric: Normal judgment and insight. Alert and oriented x 3. Normal mood.     Labs on Admission: I have personally reviewed following labs and imaging studies  CBC: No results for input(s): WBC, NEUTROABS, HGB, HCT, MCV, PLT in the last 168 hours. Basic Metabolic Panel: No results for input(s): NA, K, CL, CO2, GLUCOSE, BUN, CREATININE, CALCIUM, MG, PHOS in the last 168 hours. GFR: CrCl cannot be calculated (Patient's most recent lab result is older than the maximum 21 days allowed.). Liver Function Tests: No results for input(s): AST, ALT, ALKPHOS, BILITOT, PROT,  ALBUMIN in the last 168 hours. No results for input(s): LIPASE, AMYLASE in the last 168 hours. No results for input(s): AMMONIA in the last 168 hours. Coagulation Profile: No results for input(s): INR, PROTIME in the last 168 hours. Cardiac Enzymes: No results for input(s): CKTOTAL, CKMB, CKMBINDEX, TROPONINI in the last 168 hours. BNP (last 3 results) No results for input(s): PROBNP in the last 8760 hours. HbA1C: No results for input(s): HGBA1C in the last 72 hours. CBG: No results for input(s): GLUCAP in the last 168 hours. Lipid Profile: No results for input(s): CHOL, HDL, LDLCALC, TRIG, CHOLHDL, LDLDIRECT in the last 72 hours. Thyroid Function Tests: No results for input(s): TSH, T4TOTAL, FREET4, T3FREE, THYROIDAB in the last 72 hours. Anemia Panel: No results for input(s): VITAMINB12, FOLATE, FERRITIN, TIBC, IRON, RETICCTPCT in the last 72 hours. Urine analysis:    Component Value Date/Time   COLORURINE YELLOW 03/06/2013 1650   APPEARANCEUR CLOUDY (A) 03/06/2013 1650   LABSPEC 1.005 03/06/2013 1650  PHURINE 6.0 03/06/2013 1650   GLUCOSEU NEGATIVE 03/06/2013 1650   HGBUR NEGATIVE 03/06/2013 1650   HGBUR trace-lysed 01/13/2010 0852   BILIRUBINUR n 03/31/2015 1322   KETONESUR NEGATIVE 03/06/2013 1650   PROTEINUR trace 03/31/2015 1322   PROTEINUR NEGATIVE 03/06/2013 1650   UROBILINOGEN 1.0 03/31/2015 1322   UROBILINOGEN 0.2 03/06/2013 1650   NITRITE positive 03/31/2015 1322   NITRITE POSITIVE (A) 03/06/2013 1650   LEUKOCYTESUR large (3+) (A) 03/31/2015 1322   Sepsis Labs: No results found for this or any previous visit (from the past 240 hour(s)).   Radiological Exams on Admission: DG Hand 2 View Right  Result Date: 06/04/2019 CLINICAL DATA:  Fall EXAM: RIGHT HAND - 2 VIEW COMPARISON:  None. FINDINGS: Acute fracture of the distal right radius with dorsal displacement and angulation. No definite intra-articular extension. Additional acute fracture of the distal ulna with  significant dorsal displacement. Soft tissue swelling is present. IMPRESSION: Acute fracture of the distal right radius with dorsal displacement and angulation. Acute fracture of the distal ulna with significant dorsal displacement. Electronically Signed   By: Guadlupe Spanish M.D.   On: 06/04/2019 13:18   DG Chest Portable 1 View  Result Date: 06/04/2019 CLINICAL DATA:  Recent fall with chest pain, initial encounter EXAM: PORTABLE CHEST 1 VIEW COMPARISON:  03/14/2013 FINDINGS: Cardiac shadow is within normal limits. The lungs are hyperinflated. No focal infiltrate or sizable effusion is seen. No pneumothorax is noted. Old rib fractures are noted on the left with healing. No acute rib fractures are noted. IMPRESSION: No acute abnormality noted. Electronically Signed   By: Alcide Clever M.D.   On: 06/04/2019 15:12    Chest X-ray: Independently reviewed.  No acute abnormalities noted  Assessment/Plan Right wrist fracture secondary to fall: Acute.  Patient presents with distal radius and ulnar fractures of the right hand after recent fall approximately 2 days ago.  Orthopedics consulted and Dr. Cliffton Asters to take to the OR today. -Admit to a medical telemetry bed -N.p.o. for procedure -Oxycodone as needed for pain -PT/OT consulted -Transitions of care consult -Appreciate orthopedic consultative services, we will follow-up for further recommendation  Alcohol abuse: Patient reports drinking anywhere from 4-6 beers per day on average.  On physical exam she has a tremor present. -CIWA protocols with scheduled Ativan -Folic acid, thiamine, and multivitamin  Tobacco abuse: Patient reports smoking a pack of cigarettes per day on average.  She reports being in the process of trying to quit. -Counseled on need of cessation of tobacco use -Nicotine patch offered  Acute kidney injury: At baseline patient noted to have creatinine around 0.8.  Patient presents with creatinine elevated up to 1.13 with BUN  10. -Recheck kidney function in a.m.  Congenital solitary kidney   History of blood clots: Review of records from back in 2008 note MRI revealed thrombus of the right transverse and sigmoid sinus with cortical vein thrombosis bilaterally.  Patient is not on any blood thinners.  Gait disturbance: At baseline patient utilizes a rolling walker to ambulate.  DVT prophylaxis: lovenox Code Status: Full Family Communication: Friend present at bedside updated Disposition Plan: To be determined Consults called: Orthopedics Admission status: observation  Clydie Braun MD [facility] Pager [phone]   If 7PM-7AM, please contact night-coverage [email or web address] Password [facility]  06/04/2019, 3:29 PM

## 2019-06-04 NOTE — Progress Notes (Signed)
Orthopedic Tech Progress Note Patient Details:  [name] [birth year] [id number] Patient refused splint. Said she was getting it done in the OR today. RN was at bedside when patient stated this. Patient ID: [name], female   DOB: Jan 30, 1961, 59 y.o.   MRN: [id number]   [name] 06/04/2019, 3:20 PM

## 2019-06-04 NOTE — Anesthesia Postprocedure Evaluation (Signed)
Anesthesia Post Note  Patient: [name]  Procedure(s) Performed: OPEN REDUCTION INTERNAL FIXATION (ORIF) WRIST FRACTURE (Right Wrist)     Patient location during evaluation: PACU Anesthesia Type: Regional and MAC Level of consciousness: awake and alert Pain management: pain level controlled Vital Signs Assessment: post-procedure vital signs reviewed and stable Respiratory status: spontaneous breathing, nonlabored ventilation and respiratory function stable Cardiovascular status: stable and blood pressure returned to baseline Postop Assessment: no apparent nausea or vomiting Anesthetic complications: no    Last Vitals:  Vitals:   06/04/19 2030 06/04/19 2040  BP:  (!) 142/80  Pulse:  62  Resp:  14  Temp:    SpO2: (P) 96% 100%    Last Pain:  Vitals:   06/04/19 2040  TempSrc:   PainSc: 0-No pain                 Javen Ridings,W. EDMOND

## 2019-06-04 NOTE — Transfer of Care (Signed)
Immediate Anesthesia Transfer of Care Note  Patient: Ebone E Menon  Procedure(s) Performed: OPEN REDUCTION INTERNAL FIXATION (ORIF) WRIST FRACTURE (Right Wrist)  Patient Location: PACU  Anesthesia Type:MAC and Regional  Level of Consciousness: awake and alert   Airway & Oxygen Therapy: Patient Spontanous Breathing  Post-op Assessment: Report given to RN and Post -op Vital signs reviewed and stable  Post vital signs: Reviewed and stable  Last Vitals:  Vitals Value Taken Time  BP    Temp    Pulse    Resp    SpO2      Last Pain:  Vitals:   06/04/19 1202  TempSrc:   PainSc: 10-Worst pain ever         Complications: No apparent anesthesia complications

## 2019-06-04 NOTE — ED Triage Notes (Signed)
Pt reports falling 2 days ago when trying to get her walker due to house being worked on. Swelling noted to right hand.

## 2019-06-04 NOTE — Anesthesia Preprocedure Evaluation (Addendum)
Anesthesia Evaluation  Patient identified by MRN, date of birth, ID band Patient awake    Reviewed: Allergy & Precautions, NPO status , Patient's Chart, lab work & pertinent test results  Airway Mallampati: II  TM Distance: >3 FB Neck ROM: Full    Dental no notable dental hx. (+) Teeth Intact   Pulmonary Current Smoker,    Pulmonary exam normal breath sounds clear to auscultation       Cardiovascular negative cardio ROS Normal cardiovascular exam Rhythm:Regular Rate:Normal     Neuro/Psych Seizures -, Well Controlled,  PSYCHIATRIC DISORDERS Anxiety Depression Bipolar Disorder  Neuromuscular disease    GI/Hepatic negative GI ROS, (+)     substance abuse  alcohol use, Last ETOH yesterday pm   Endo/Other  negative endocrine ROS  Renal/GU Renal InsufficiencyRenal disease  negative genitourinary   Musculoskeletal  (+) Fibromyalgia -Fx right wrist   Abdominal   Peds  Hematology negative hematology ROS (+)   Anesthesia Other Findings   Reproductive/Obstetrics                            Anesthesia Physical Anesthesia Plan  ASA: II  Anesthesia Plan: Regional   Post-op Pain Management:  Regional for Post-op pain   Induction: Intravenous  PONV Risk Score and Plan: 3 and Ondansetron, Treatment may vary due to age or medical condition and Midazolam  Airway Management Planned: Natural Airway and Simple Face Mask  Additional Equipment: None  Intra-op Plan:   Post-operative Plan:   Informed Consent: I have reviewed the patients History and Physical, chart, labs and discussed the procedure including the risks, benefits and alternatives for the proposed anesthesia with the patient or authorized representative who has indicated his/her understanding and acceptance.     Dental advisory given  Plan Discussed with: CRNA and Surgeon  Anesthesia Plan Comments:        Anesthesia Quick  Evaluation

## 2019-06-04 NOTE — Anesthesia Procedure Notes (Signed)
Procedure Name: MAC Date/Time: 06/04/2019 6:00 PM Performed by: Babs Bertin, CRNA Pre-anesthesia Checklist: Patient identified, Emergency Drugs available, Suction available, Patient being monitored and Timeout performed Patient Re-evaluated:Patient Re-evaluated prior to induction Oxygen Delivery Method: Nasal cannula

## 2019-06-04 NOTE — ED Provider Notes (Addendum)
[facility] EMERGENCY DEPARTMENT Provider Note   CSN: [id number] Arrival date & time: 06/04/19  1148    History Chief Complaint  Patient presents with  . Fall    [name] is a 59 y.o. 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  Patient with mechanical fall 2 days ago when trying to get to her walker.  Fell onto her right outstretched hand.  She denies hitting her head, LOC or anticoagulation.  Patient with continued pain and swelling since then.  Pain located to her right wrist and hand.  Has been taking Tylenol and aspirin.  Rates her pain a 7/10.  Worse with movement.  She denies headache, lightheadedness, dizziness, nausea, vomiting, chest pain, shortness of breath, lower extremity pain, paresthesias, bleeding or drainage.  She has been ambulatory since the incident.  No emesis.  Denies aggravating or relieving factors.  History of alcohol abuse.  Drinks approximately 5 beers daily.  Last drink yesterday.  She denies any prior withdrawal seizures however states she has had a seizure previously however states she thought it was due to "stress."  She is tremulous on exam.  History obtained from patient and past medical records.  No iterpreter is used.  Denies current PCP  HPI     Past Medical History:  Diagnosis Date  . Anal fissure   . Anxiety   . Bipolar 1 disorder (HCC)   . Cerebral hemorrhage (HCC)   . Clotting disorder (HCC)    hx of blood clot  . Congenital single kidney   . Depression   . Endometriosis   . Fibromyalgia   . Hemorrhoids   . History of elevated homocysteine   . Seizures (HCC) 03/18/15  . 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 Right wrist fracture 06/04/2019  . Alcoholic ketoacidosis 08/12/2018  . Pubic ramus fracture, left, closed,  initial encounter (HCC) 08/12/2018  . Hyponatremia 08/12/2018  . Abnormality of gait and mobility 08/12/2018  . Fibromyalgia 08/12/2018  . Acute alcohol intoxication (HCC) 08/12/2018  . Anal fissure 03/02/2011  . LACERATION, SCALP 08/25/2008  . Anxiety state 05/23/2008  . EPIGASTRIC PAIN 05/23/2008  . DEEP VENOUS THROMBOPHLEBITIS, HX OF 11/14/2006  . EMBOLISM/THROMBOSIS, DEEP VSL PRXML LWR EXTRM 08/28/2006    Past Surgical History:  Procedure Laterality Date  . APPENDECTOMY    . BREAST SURGERY     benign breast lump removal  . CESAREAN SECTION    . LAPAROSCOPY     endometriosis  . LEEP  2000  . TONSILLECTOMY    . TUBAL LIGATION       OB History   No obstetric history on file.     Family History  Problem Relation Age of Onset  . Heart disease Mother   . Cancer Sister        breast  . Heart disease Brother   . Coronary artery disease Other   . Stroke Other   . Hypertension Other     Social History   Tobacco Use  . Smoking status: Current Every Day Smoker    Packs/day: 1.00    Types: Cigarettes  . Smokeless tobacco: Never Used  Substance Use Topics  . Alcohol use: Yes    Alcohol/week: 28.0 standard drinks    Types: 28 Cans of beer per week  Comment: frequently   . Drug use: No    Home Medications Prior to Admission medications   Medication Sig Start Date End Date Taking? Authorizing Provider  aspirin 325 MG tablet Take 650 mg by mouth every 6 (six) hours as needed (for pain).    Yes [provider]  CALCIUM PO Take 1 tablet by mouth 2 (two) times a week.   Yes [provider]  ibuprofen (ADVIL) 800 MG tablet Take 800 mg by mouth every 8 (eight) hours as needed (for pain).   Yes [provider]  Multiple Vitamin (MULTIVITAMIN WITH MINERALS) TABS tablet Take 1 tablet by mouth 3 (three) times a week.    Yes [provider]    Allergies    Acetaminophen and Compazine  Review of Systems   Review of Systems    Constitutional: Negative.   HENT: Negative.   Respiratory: Negative.   Cardiovascular: Negative.   Gastrointestinal: Negative.   Genitourinary: Negative.   Musculoskeletal: Positive for arthralgias, gait problem (Chronic, walker at baseline) and joint swelling. Negative for back pain, myalgias, neck pain and neck stiffness.       Right hand and wrist pain  Skin: Positive for wound.  All other systems reviewed and are negative.   Physical Exam Updated Vital Signs BP (!) 156/86   Pulse 64   Temp 99 F (37.2 C)   Resp (!) 23   Ht 5\' 2"  (1.575 m)   Wt 43.5 kg   SpO2 100%   BMI 17.56 kg/m   Physical Exam Vitals and nursing note reviewed.  Constitutional:      General: She is not in acute distress.    Appearance: She is well-developed. She is not ill-appearing, toxic-appearing or diaphoretic.  HENT:     Head: Normocephalic and atraumatic.     Nose: Nose normal.     Mouth/Throat:     Mouth: Mucous membranes are moist.     Pharynx: Oropharynx is clear.  Eyes:     Pupils: Pupils are equal, round, and reactive to light.  Cardiovascular:     Rate and Rhythm: Normal rate.     Pulses: Normal pulses.     Heart sounds: Normal heart sounds.  Pulmonary:     Effort: Pulmonary effort is normal. No respiratory distress.     Breath sounds: Normal breath sounds.  Abdominal:     General: Bowel sounds are normal. There is no distension.     Tenderness: There is no abdominal tenderness. There is no right CVA tenderness, left CVA tenderness, guarding or rebound.  Musculoskeletal:     Right shoulder: Normal.     Left shoulder: Normal.     Right upper arm: Normal.     Left upper arm: Normal.     Right elbow: Normal.     Left elbow: Normal.     Right forearm: Swelling, edema, deformity, tenderness and bony tenderness present. No lacerations.     Left forearm: Normal.     Right wrist: Swelling, deformity and tenderness present. No lacerations. Decreased range of motion.     Right hand:  Swelling and tenderness present. No deformity. Normal range of motion.     Left hand: Normal.     Cervical back: Normal and normal range of motion.     Thoracic back: Normal.     Lumbar back: Normal.     Right hip: Normal.     Left hip: Normal.     Right knee: Normal.  Left knee: Normal.     Right lower leg: Normal.     Left lower leg: Normal.     Comments: No midline cervical, thoracic or lumbar tenderness, crepitus or step-offs.  Pelvis stable, nontender to palpation.  No bony tenderness to lower extremities.  Patient with deformity to distal right radius and ulna near wrist.  Patient with swelling to right hand to mid forearm.  No tenderness to midshaft radius or ulna as well as proximal radius and ulna.  There is ecchymosis.  No obvious lacerations.  She is able to wiggle her fingers without difficulty.  Unable to flex, extend supinate pronate at wrist due to pain.  To flex and extend at elbow as well as left shoulder. Tremulous on exam.  Skin:    General: Skin is warm and dry.     Capillary Refill: Capillary refill takes less than 2 seconds.     Comments: Ecchymosis, swelling to right hand, distal radius and ulna  Neurological:     Mental Status: She is alert.     Comments: Wiggles fingers without difficulty.  Able to grip to right hand.  Intact sensation to radial ulnar aspect to wrist and digits.    ED Results / Procedures / Treatments   Labs (all labs ordered are listed, but only abnormal results are displayed) Labs Reviewed  CBC WITH DIFFERENTIAL/PLATELET - Abnormal; Notable for the following components:      Result Value   Hemoglobin 15.5 (*)    HCT 46.3 (*)    MCV 105.2 (*)    MCH 35.2 (*)    Neutro Abs 8.8 (*)    All other components within normal limits  BASIC METABOLIC PANEL - Abnormal; Notable for the following components:   Glucose, Bld 127 (*)    Creatinine, Ser 1.13 (*)    GFR calc non Af Amer 54 (*)    All other components within normal limits  I-STAT CHEM  8, ED - Abnormal; Notable for the following components:   Glucose, Bld 105 (*)    Calcium, Ion 1.12 (*)    All other components within normal limits  RESPIRATORY PANEL BY RT PCR (FLU A&B, COVID)  ETHANOL    EKG None  Radiology DG Hand 2 View Right  Result Date: 06/04/2019 CLINICAL DATA:  Fall EXAM: RIGHT HAND - 2 VIEW COMPARISON:  None. FINDINGS: Acute fracture of the distal right radius with dorsal displacement and angulation. No definite intra-articular extension. Additional acute fracture of the distal ulna with significant dorsal displacement. Soft tissue swelling is present. IMPRESSION: Acute fracture of the distal right radius with dorsal displacement and angulation. Acute fracture of the distal ulna with significant dorsal displacement. Electronically Signed   By: Macy Mis M.D.   On: 06/04/2019 13:18   DG Chest Portable 1 View  Result Date: 06/04/2019 CLINICAL DATA:  Recent fall with chest pain, initial encounter EXAM: PORTABLE CHEST 1 VIEW COMPARISON:  03/14/2013 FINDINGS: Cardiac shadow is within normal limits. The lungs are hyperinflated. No focal infiltrate or sizable effusion is seen. No pneumothorax is noted. Old rib fractures are noted on the left with healing. No acute rib fractures are noted. IMPRESSION: No acute abnormality noted. Electronically Signed   By: Inez Catalina M.D.   On: 06/04/2019 15:12   Procedures Procedures (including critical care time)  Medications Ordered in ED Medications  sodium chloride 0.9 % bolus 1,000 mL (has no administration in time range)  LORazepam (ATIVAN) injection 0-4 mg (1 mg Intravenous Given  06/04/19 1533)    Or  LORazepam (ATIVAN) tablet 0-4 mg ( Oral See Alternative 06/04/19 1533)  LORazepam (ATIVAN) injection 0-4 mg (has no administration in time range)    Or  LORazepam (ATIVAN) tablet 0-4 mg (has no administration in time range)  thiamine tablet 100 mg ( Oral See Alternative 06/04/19 1532)    Or  thiamine (B-1) injection 100  mg (100 mg Intravenous Given 06/04/19 1532)  nicotine (NICODERM CQ - dosed in mg/24 hours) patch 21 mg (has no administration in time range)  enoxaparin (LOVENOX) injection 30 mg (has no administration in time range)  sodium chloride flush (NS) 0.9 % injection 3 mL (has no administration in time range)  ondansetron (ZOFRAN) tablet 4 mg (has no administration in time range)    Or  ondansetron (ZOFRAN) injection 4 mg (has no administration in time range)  albuterol (PROVENTIL) (2.5 MG/3ML) 0.083% nebulizer solution 2.5 mg (has no administration in time range)  LORazepam (ATIVAN) tablet 1-4 mg (has no administration in time range)    Or  LORazepam (ATIVAN) injection 1-4 mg (has no administration in time range)  folic acid (FOLVITE) tablet 1 mg (has no administration in time range)  multivitamin with minerals tablet 1 tablet (has no administration in time range)  oxyCODONE (Oxy IR/ROXICODONE) immediate release tablet 5 mg (has no administration in time range)  oxyCODONE (Oxy IR/ROXICODONE) immediate release tablet 5 mg (5 mg Oral Given 06/04/19 1400)  ondansetron (ZOFRAN) injection 4 mg (4 mg Intravenous Given 06/04/19 1532)  fentaNYL (SUBLIMAZE) injection 50 mcg (50 mcg Intravenous Given 06/04/19 1606)   ED Course  I have reviewed the triage vital signs and the nursing notes.  Pertinent labs & imaging results that were available during my care of the patient were reviewed by me and considered in my medical decision making (see chart for details).  59 year old female presents for evaluation mechanical fall 2 days ago.  Patient with obvious deformity, swelling and ecchymosis to the right wrist.  She is neurovascularly intact.  Limited range of motion to right wrist secondary to pain however is able to wiggle fingers and has grip strength to right wrist.  She has no bony tenderness or deformity to midshaft radius, ulna or proximal radius or ulna.  He is tremulous on exam and does have history of alcohol  abuse.  Drinks 4-5 beers nightly.  Does have history of seizures however states this was not due to alcohol withdrawal.  She is not currently followed by PCP.  Her pelvis is stable, nontender palpation.  She is walking with a walker at baseline which she had prior to fall.  She denies hitting her head, LOC or anticoagulation.  Imaging obtained from triage which I personally reviewed.  DG right wrist shows angulated, displaced radius and ulna fracture.  Compartments soft.  Low suspicion for acute intracranial abnormality, spine fracture, other acute bony fracture.  Consult with Earney Hamburg, PA-C with orthopedics.  He will evaluate patient.  1445: Patient to go to the OR. Requests medicine to admit given patient's extensive medical history and not currently followed by PCP.  Discusses splint with patient for pain control and stability prior to OR.   Patient REFUSED SPLINTING IN ED.  Labs and imaging personally reviewed and interpreted: CBC without leukocytosis, hemoglobin 15.5 Chem-8 with mild hyperglycemia to 105 however no additional electrolyte, renal abnormality BMP with hyperglycemia to 127, creatinine 1.13, GFR 54, no additional electrolyte, renal or liver normality Ethanol pending  COVID pending  DG  chest without any acute infiltrates, cardiomegaly, pulmonary edema, pneumothorax  CIWA orders placed with additional pain control, and IVF. Will consult with hospitalist to admit, given patient to go to the OR as soon as possible will consult with admitting team prior to lab returning to the may evaluate her prior to a walker.  CONSULT with Dr. Katrinka BlazingSmith with [facility] who agrees to evaluate patient for admission.  Did discuss with admitting provider that he was contacted prior to labs due to patient needing a walker.  I was concerned that patient be taken to the OR prior to admitting team evaluating patient and this is why he was contacted early.  Patient discussed with attending, Dr. Pilar PlateBero who  agrees with above treatment, plan and disposition.  The patient appears reasonably stabilized for admission considering the current resources, flow, and capabilities available in the ED at this time, and I doubt any other [facility] requiring further screening and/or treatment in the ED prior to admission.    MDM Rules/Calculators/A&P                       Final Clinical Impression(s) / ED Diagnoses Final diagnoses:  Fall, initial encounter  Other closed fracture of distal end of right radius, initial encounter  Other closed fracture of distal end of right ulna, initial encounter  Alcohol abuse    Rx / DC Orders ED Discharge Orders    None       Donnell Beauchamp A, PA-C 06/04/19 1621    Samiah Ricklefs A, PA-C 06/04/19 1631    Sabas SousBero, Michael M, MD 06/05/19 [phone]

## 2019-06-05 ENCOUNTER — Encounter (HOSPITAL_COMMUNITY): Payer: Self-pay | Admitting: Internal Medicine

## 2019-06-05 DIAGNOSIS — W19XXXA Unspecified fall, initial encounter: Secondary | ICD-10-CM

## 2019-06-05 DIAGNOSIS — F1010 Alcohol abuse, uncomplicated: Secondary | ICD-10-CM

## 2019-06-05 DIAGNOSIS — R269 Unspecified abnormalities of gait and mobility: Secondary | ICD-10-CM

## 2019-06-05 DIAGNOSIS — Z720 Tobacco use: Secondary | ICD-10-CM

## 2019-06-05 DIAGNOSIS — Y92009 Unspecified place in unspecified non-institutional (private) residence as the place of occurrence of the external cause: Secondary | ICD-10-CM

## 2019-06-05 DIAGNOSIS — S62101A Fracture of unspecified carpal bone, right wrist, initial encounter for closed fracture: Secondary | ICD-10-CM

## 2019-06-05 MED ORDER — OXYCODONE HCL 5 MG PO TABS: 5.0000 mg | ORAL_TABLET | Freq: Four times a day (QID) | ORAL | 0 refills | Status: AC | PRN

## 2019-06-05 MED ORDER — VASOPRESSIN 20 UNIT/ML IV SOLN
INTRAVENOUS | Status: AC
  Filled 2019-06-05: qty 1

## 2019-06-05 MED ORDER — CEPHALEXIN 500 MG PO CAPS: 500.0000 mg | ORAL_CAPSULE | Freq: Four times a day (QID) | ORAL | 0 refills | Status: AC

## 2019-06-05 MED FILL — CEPHALEXIN 500 MG CAPS: 500 | 7 days supply | Qty: 28 | Fill #0

## 2019-06-05 MED FILL — oxyCODONE HCL 5 MG TABS: 5 | 7 days supply | Qty: 28 | Fill #0

## 2019-06-05 NOTE — TOC Initial Note (Addendum)
Transition of Care [facility]) - Initial/Assessment Note    Patient Details  Name: [name] MRN: [id number] Date of Birth: Sep 21, 1960  Transition of Care [facility]) CM/SW Contact:    Marilu Favre, RN Phone Number: 06/05/2019, 12:32 PM  Clinical Narrative:                  Confirmed face sheet information with patient.   Patient from home alone, does not want SNF. If NCM can arrange charity home health patient would be agreeable to home health.   Patient gave permission for NCM to call her friend Jenny Reichmann. Spoke with Jenny Reichmann via phone in [employer]. Discussed possible home health . John states he and patient's sister can assist her at home and provide transportation to MD appointments.   Patient has been seeing Dr Sarajane Jews for PCP, sent covid been doing video calls. John lives close to [place] and could provide transportation to appointments easier there . Also if patient establishes care there she can use [employer] [facility] [employer]. Patient and Jenny Reichmann in agreement. NCM will call to arrange an appointment .   Patient has a handicap bathroom and a walker and cane already at home.   Called Cassie with [facility] left message regarding home health care awaiting call back.   Changed patient's pharmacy to [facility] will assit with discharge prescriptions with [facility].   First available appointment at [place] is July 22, 2019 at 0920 am. On AVS  Expected Discharge Plan: [place] Barriers to Discharge: Continued Medical Work up   Patient Goals and CMS Choice Patient states their goals for this hospitalization and ongoing recovery are:: to return to home CMS Medicare.gov Compare Post Acute Care list provided to:: Patient Choice offered to / list presented to : Patient  Expected Discharge Plan and Services Expected Discharge Plan: [place] In-house Referral: Financial Counselor Discharge Planning Services: CM Consult Post Acute Care  Choice: [place] arrangements for the past 2 months: Single Family Home                 DME Arranged: N/A DME Agency: NA       HH Arranged: PT HH Agency: [facility] Date HH Agency Contacted: 06/05/19 Time [place]: 1229 Representative spoke with at [place]: Left message for Cassie  Prior Living Arrangements/Services Living arrangements for the past 2 months: Single Family Home Lives with:: Self Patient language and need for interpreter reviewed:: Yes Do you feel safe going back to the place where you live?: Yes      Need for Family Participation in Patient Care: Yes (Comment) Care giver support system in place?: Yes (comment)(John stated he and patient's sister can help) Current home services: DME Criminal Activity/Legal Involvement Pertinent to Current Situation/Hospitalization: No - Comment as needed  Activities of Daily Living Home Assistive Devices/Equipment: Eyeglasses, Gilford Rile (specify type) ADL Screening (condition at time of admission) Patient's cognitive ability adequate to safely complete daily activities?: Yes Is the patient deaf or have difficulty hearing?: No Does the patient have difficulty seeing, even when wearing glasses/contacts?: No(uses reading glasses) Does the patient have difficulty concentrating, remembering, or making decisions?: No Patient able to express need for assistance with ADLs?: Yes Weakness of Arms/Hands: Right  Permission Sought/Granted   Permission granted to share information with : Yes, Verbal Permission Granted  Share Information with NAME: John [phone] friend  Emotional Assessment Appearance:: Appears stated age Attitude/Demeanor/Rapport: Engaged Affect (typically observed): Accepting Orientation: : Oriented to Self, Oriented to Place, Oriented to  Time, Oriented to Situation Alcohol / Substance Use: Not Applicable Psych Involvement: No (comment)  Admission diagnosis:  Alcohol  abuse [F10.10] Right wrist fracture [S62.101A] Fall, initial encounter [W19.XXXA] Other closed fracture of distal end of right radius, initial encounter [S52.591A] Other closed fracture of distal end of right ulna, initial encounter [S52.691A] Patient Active Problem List   Diagnosis Date Noted  . Right wrist fracture 06/04/2019  . Fall at home, initial encounter 06/04/2019  . Alcohol abuse 06/04/2019  . AKI (acute kidney injury) (HCC) 06/04/2019  . Tobacco abuse 06/04/2019  . Alcoholic ketoacidosis 08/12/2018  . Pubic ramus fracture, left, closed, initial encounter (HCC) 08/12/2018  . Hyponatremia 08/12/2018  . Gait disturbance 08/12/2018  . Fibromyalgia 08/12/2018  . Acute alcohol intoxication (HCC) 08/12/2018  . Anal fissure 03/02/2011  . LACERATION, SCALP 08/25/2008  . Anxiety state 05/23/2008  . EPIGASTRIC PAIN 05/23/2008  . DEEP VENOUS THROMBOPHLEBITIS, HX OF 11/14/2006  . EMBOLISM/THROMBOSIS, DEEP VSL PRXML LWR EXTRM 08/28/2006   PCP:  Nelwyn Salisbury, MD Pharmacy:   [facility] [phone] [place], [place] - [address] AT [facility] OF [address] & [address] [facility] [address] [place] [address] Phone: [phone] Fax: [phone]  [facility] [place], [place] - [address] [place] [address] Phone: [phone] Fax: [phone]     Social Determinants of Health (SDOH) Interventions    Readmission Risk Interventions No flowsheet data found.

## 2019-06-05 NOTE — Progress Notes (Signed)
Patient ID: [name], female   DOB: Oct 07, 1960, 59 y.o.   MRN: [id number] Patient is seen and examined at bedside.  Postop day 1 status post ORIF right radius fracture.  Her right upper extremity has normal refill and her block is wearing off.  She has feeling in her fingers but still does not have complete resolution of the nerve block postop.  I explained to her the operative findings and other issues germane to her predicament.  At present time no evidence of compartment syndrome or other problems and no evidence of complication at this date.  I discussed all issues with the patient.  I discussed with the patient the need to decrease her smoking and eliminate her alcohol consumption if at all possible.  I explained to the patient that I would like to see her back in 14 days for follow-up check which will include suture removal and casting.  I would like to delay any administration of blood thinners until this evening.  If she needs further stay in the hospital then Lovenox would be fine to introduce at that time (that is tonight).  She is stable from my standpoint for discharge.  I appreciate the kind help of the hospitalist in regards to this complex patient.  Certainly she has multiple medical challenges which give her some degree of worry in regards to her proper follow-up and living situation etc.  Her family seems to help her the most.  She lives alone.  I did put in a case management consult.  Once again, she is stable from my standpoint status post reconstruction right wrist.  I will see her back in 14 days.  I would recommend Ancef through her stay and Keflex 500 mg 1 p.o. 4 times daily x7 days on discharge given the severe hematoma reaction and delay from injury to operative event.  I be available for any questions.  Dominica Severin MD  Cell phone [phone]

## 2019-06-05 NOTE — Progress Notes (Signed)
Discussed with the patient and all questioned fully answered. Belongings bagged. Prescriptions for Keflex and oxycodone received from transitional pharmacy. Patients friend, Jonny Ruiz, transporting home. No needs voiced at this time.

## 2019-06-05 NOTE — Discharge Instructions (Signed)
Closed Reduction for Wrist or Forearm  A closed reduction for the wrist or forearm is a procedure to move the bones back into place after they are broken (fractured) or moved out of their normal position (dislocated). In this procedure, the health care provider moves the bones back into position by hand. This procedure is done without an incision or surgery. Your forearm is made up of two long bones called the radius and the ulna. These bones connect your forearm to your wrist. If a forearm bone is fractured on the end closest to the wrist joint, sometimes the bones do not move very far out of place (stable fracture). You may have a closed reduction if the fractured or displaced bones of your wrist or forearm will stay stable with a cast or splint after they are moved back into their normal positions. Tell a health care provider about:  Any allergies you have.  All medicines you are taking, including vitamins, herbs, eye drops, creams, and over-the-counter medicines.  Any problems you or family members have had with anesthetic medicines.  Any blood disorders you have.  Any surgeries you have had.  Any medical conditions you have.  Whether you are pregnant or may be pregnant. What are the risks? Generally, this is a safe procedure. However, problems may occur, including:  Infection.  Bleeding.  Allergic reactions to medicines.  Damage to nerves or blood vessels.  Failure to heal.  Continued pain or stiffness in the wrist. What happens before the procedure? Staying hydrated Follow instructions from your health care provider about hydration, which may include:  Up to 2 hours before the procedure - you may continue to drink clear liquids, such as water, clear fruit juice, black coffee, and plain tea. Eating and drinking restrictions Follow instructions from your health care provider about eating and drinking, which may include:  8 hours before the procedure - stop eating  heavy meals or foods, such as meat, fried foods, or fatty foods.  6 hours before the procedure - stop eating light meals or foods, such as toast or cereal.  6 hours before the procedure - stop drinking milk or drinks that contain milk.  2 hours before the procedure - stop drinking clear liquids. Medicines Ask your health care provider about:  Changing or stopping your regular medicines. This is especially important if you are taking diabetes medicines or blood thinners.  Taking medicines such as aspirin and ibuprofen. These medicines can thin your blood. Do not take these medicines unless your health care provider tells you to take them.  Taking over-the-counter medicines, vitamins, herbs, and supplements. General instructions  You may have a physical exam. The exam may include X-rays to find out more about your condition.  Plan to have someone take you home from the hospital or clinic.  If you will be going home right after the procedure, plan to have someone with you for 24 hours.  Ask your health care provider what steps will be taken to help prevent infection. These may include washing your skin with a germ-killing soap. What happens during the procedure?  An IV may be inserted into one of your veins.  You may be given one or more of the following: ? A medicine to help you relax (sedative). ? A medicine to make you fall asleep (general anesthetic). ? A medicine that is injected into an area of your body to numb everything below the injection site (regional anesthetic).  Your health care provider will  move the broken bones back into their normal position.  You will have more X-rays to make sure the bones are in the right position.  You will have to wear a cast or splint to hold the bones in place while they heal. The procedure may vary among health care providers and hospitals. What happens after the procedure?   Your blood pressure, heart rate, breathing rate, and blood  oxygen level will be monitored until you leave the hospital or clinic.  Your arm and hand will be raised (elevated).  You will be given medicine for pain as needed.  Do not drive for 24 hours if you were given a sedative during your procedure. Summary  A closed reduction for your wrist or forearm is used when you have broken or dislocated one or more bones in your arm.  A closed reduction puts the bones back in their normal position without an incision or surgery.  After the closed reduction procedure, your wrist or forearm will be placed in a splint or cast. This information is not intended to replace advice given to you by your health care provider. Make sure you discuss any questions you have with your health care provider. Document Revised: 08/16/2018 Document Reviewed: 08/16/2018 Elsevier Patient Education  2020 [employer]. Keep bandage clean and dry.  Call for any problems.  No smoking.  Criteria for driving a car: you should be off your pain medicine for 7-8 hours, able to drive one handed(confident), thinking clearly and feeling able in your judgement to drive. Continue elevation as it will decrease swelling.  If instructed by MD move your fingers within the confines of the bandage/splint.  Use ice if instructed by your MD. Call immediately for any sudden loss of feeling in your hand/arm or change in functional abilities of the extremity.We recommend that you to take vitamin C 1000 mg a day to promote healing. We also recommend that if you require  pain medicine that you take a stool softener to prevent constipation as most pain medicines will have constipation side effects. We recommend either Peri-Colace or Senokot and recommend that you also consider adding MiraLAX as well to prevent the constipation affects from pain medicine if you are required to use them. These medicines are over the counter and may be purchased at a local pharmacy. A cup of yogurt and a probiotic can also be  helpful during the recovery process as the medicines can disrupt your intestinal environment.

## 2019-06-05 NOTE — Discharge Summary (Signed)
Discharge Summary  [name] PPJ:[id number] DOB: 1960/11/14  PCP: Nelwyn Salisbury, MD  Admit date: 06/04/2019 Discharge date: 06/05/2019  Time spent: 30 mins  Recommendations for Outpatient Follow-up:  1. Orthopedics Dr. Amanda Pea 2. PCP to establish care  Discharge Diagnoses:  Active Hospital Problems   Diagnosis Date Noted  . Right wrist fracture 06/04/2019  . Fall at home, initial encounter 06/04/2019  . Alcohol abuse 06/04/2019  . AKI (acute kidney injury) (HCC) 06/04/2019  . Tobacco abuse 06/04/2019  . Gait disturbance 08/12/2018    Resolved Hospital Problems  No resolved problems to display.    Discharge Condition: Stable  Diet recommendation: As tolerated  Vitals:   06/05/19 0826 06/05/19 1351  BP: (!) 152/90 135/79  Pulse: (!) 101 75  Resp:  14  Temp:  98.5 F (36.9 C)  SpO2:  98%    History of present illness:  Terri E Bolandis a 59 y.o.femalewith medical history significant ofhistory of bipolar disorder, alcohol abuse, fibromyalgia, remote seizure, congenital single kidney, tobacco abuse, and gait disturbance. [name] utilizes a rolling walker at baseline. At the time of the fall her walker was turned backwards and when [name] tried to pull the closer it toppled causing her to fall on to her right hand. [name] reported having severe pain in her right wrist with swelling. Any kind of movement worsens symptoms.Patient normally drinks anywhere from 4-6 beers per day on average. [name] has cut back to smoking only 1 pack of cigarettes per day on average currently. In the ED, patient was noted to bemildly tachypneic with blood pressure 124/94-156/86, and all other vital signs maintained. Chest x-ray was otherwise noted to be clear. X-rays of the right hand revealed distal radius and ulna fractures. Orthopedics consulted. [facility] called to admit.    Today, patient reports some postop pain in right hand.  Denies any other new complaints, denies any chest pain,  shortness of breath, abdominal pain, nausea/vomiting, fever/chills.  PT/OT recommended SNF for patient of which patient refused, also refused home health services unless it is done by charity.  Case manager spoke to friend Jonny Ruiz who states that he and patient's sister can assist her at home and provide transportation to MD appointments.  Patient has a handicap bathroom and a walker and cane at home.  Patient advised extensively to quit smoking and quit drinking alcohol.  Patient advised to follow-up with orthopedic surgeon as well as PCP to establish care.    Hospital Course:  Principal Problem:   Right wrist fracture Active Problems:   Gait disturbance   Fall at home, initial encounter   Alcohol abuse   AKI (acute kidney injury) (HCC)   Tobacco abuse   Right wrist fracture secondary to fall status post ORIF on 06/04/2019 Orthopedics Dominica Severin on board, recommend outpatient follow-up for suture removal and cast application Discharge patient on Keflex as recommended by orthopedics as well as narcotics for pain management  Alcohol abuse Patient reports drinking anywhere from 4-6 beers per day on average Advised to quit alcohol  Tobacco abuse Patient reports smoking a pack of cigarettes per day on average.  [name] reports being in the process of trying to quit. 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  Patient is not on any blood thinners.  Gait disturbance At baseline patient utilizes a  rolling walker to ambulate.         Malnutrition Type:      Malnutrition Characteristics:      Nutrition Interventions:      Estimated body mass index is 17.56 kg/m as calculated from the following:   Height as of this encounter: 5\' 2"  (1.575 m).   Weight as of this encounter: 43.5 kg.     Procedures:  None  Consultations:  Orthopedics, Dr. Amedeo Plenty  Discharge Exam: BP 135/79 (BP Location: Left Arm)   Pulse 75   Temp 98.5 F (36.9 C) (Oral)   Resp 14   Ht 5\' 2"  (1.575 m)   Wt 43.5 kg   SpO2 98%   BMI 17.56 kg/m   General: NAD Cardiovascular: S1, S2 present Respiratory: CTA B  Discharge Instructions You were cared for by a hospitalist during your hospital stay. If you have any questions about your discharge medications or the care you received while you were in the hospital after you are discharged, you can call the unit and asked to speak with the hospitalist on call if the hospitalist that took care of you is not available. Once you are discharged, your primary care physician will handle any further medical issues. Please note that NO REFILLS for any discharge medications will be authorized once you are discharged, as it is imperative that you return to your primary care physician (or establish a relationship with a primary care physician if you do not have one) for your aftercare needs so that they can reassess your need for medications and monitor your lab values.  Discharge Instructions    Diet - low sodium heart healthy   Complete by: As directed    Increase activity slowly   Complete by: As directed      Allergies as of 06/05/2019      Reactions   Acetaminophen Other (See Comments)   Patient stated, " I overdosed on it."   Compazine Swelling, Other (See Comments)   Convulsions/shaking and couldn't swallow anything      Medication List    TAKE these medications   aspirin 325 MG tablet Take 650 mg by mouth every 6 (six) hours as needed (for pain).   CALCIUM PO Take 1 tablet by mouth 2 (two) times a week.   cephALEXin 500 MG capsule Commonly known as: KEFLEX Take 1 capsule (500 mg total) by mouth 4 (four) times daily for 7 days.   ibuprofen 800 MG tablet Commonly known as: ADVIL Take 800 mg by mouth every 8 (eight) hours as needed (for  pain).   multivitamin with minerals Tabs tablet Take 1 tablet by mouth 3 (three) times a week.   oxyCODONE 5 MG immediate release tablet Commonly known as: Oxy IR/ROXICODONE Take 1 tablet (5 mg total) by mouth every 6 (six) hours as needed for up to 7 days for severe pain.      Allergies  Allergen Reactions  . Acetaminophen Other (See Comments)    Patient stated, " I overdosed on it."  . Compazine Swelling and Other (See Comments)    Convulsions/shaking and couldn't swallow anything   Follow-up Information    Roseanne Kaufman, MD Follow up in 14 day(s).   Specialty: Orthopedic Surgery Why: Please call the office to verify follow-up appointment in 14 days.  You will see Dr. Amedeo Plenty and have your sutures removed.  We will also placed a cast on your arm and see you back thereafter in 2 additional weeks.  Once again  please call the office to ve Contact information: [address] [place] [address] [place] [address] [phone]        [facility] Follow up.   Specialty: Internal Medicine Why: follow up appointment on July 22, 2019 at 0920 , bring ID  Contact information: [address] [place] [address] [id number] [address] [place] [address] [phone]       [facility] Follow up.   Why: Use pharmacy  Contact information: [address] [place] [address] [phone]           The results of significant diagnostics from this hospitalization (including imaging, microbiology, ancillary and laboratory) are listed below for reference.    Significant Diagnostic Studies: DG Hand 2 View Right  Result Date: 06/04/2019 CLINICAL DATA:  Fall EXAM: RIGHT HAND - 2 VIEW COMPARISON:  None. FINDINGS: Acute fracture of the distal right radius with dorsal displacement and angulation. No definite intra-articular extension. Additional acute fracture of the distal ulna with significant dorsal displacement. Soft tissue  swelling is present. IMPRESSION: Acute fracture of the distal right radius with dorsal displacement and angulation. Acute fracture of the distal ulna with significant dorsal displacement. Electronically Signed   By: Guadlupe Spanish M.D.   On: 06/04/2019 13:18   DG Chest Portable 1 View  Result Date: 06/04/2019 CLINICAL DATA:  Recent fall with chest pain, initial encounter EXAM: PORTABLE CHEST 1 VIEW COMPARISON:  03/14/2013 FINDINGS: Cardiac shadow is within normal limits. The lungs are hyperinflated. No focal infiltrate or sizable effusion is seen. No pneumothorax is noted. Old rib fractures are noted on the left with healing. No acute rib fractures are noted. IMPRESSION: No acute abnormality noted. Electronically Signed   By: Alcide Clever M.D.   On: 06/04/2019 15:12    Microbiology: Recent Results (from the past 240 hour(s))  Respiratory Panel by RT PCR (Flu A&B, Covid) - Nasopharyngeal Swab     Status: None   Collection Time: 06/04/19  2:45 PM   Specimen: Nasopharyngeal Swab  Result Value Ref Range Status   SARS Coronavirus 2 by RT PCR NEGATIVE NEGATIVE Final    Comment: (NOTE) SARS-CoV-2 target nucleic acids are NOT DETECTED. The SARS-CoV-2 RNA is generally detectable in upper respiratoy specimens during the acute phase of infection. The lowest concentration of SARS-CoV-2 viral copies this assay can detect is 131 copies/mL. A negative result does not preclude SARS-Cov-2 infection and should not be used as the sole basis for treatment or other patient management decisions. A negative result may occur with  improper specimen collection/handling, submission of specimen other than nasopharyngeal swab, presence of viral mutation(s) within the areas targeted by this assay, and inadequate number of viral copies (<131 copies/mL). A negative result must be combined with clinical observations, patient history, and epidemiological information. The expected result is Negative. Fact Sheet for  Patients:  https://www.moore.com/ Fact Sheet for Healthcare Providers:  https://www.young.biz/ This test is not yet ap proved or cleared by the [place] FDA and  has been authorized for detection and/or diagnosis of SARS-CoV-2 by FDA under an Emergency Use Authorization (EUA). This EUA will remain  in effect (meaning this test can be used) for the duration of the COVID-19 declaration under Section 564(b)(1) of the Act, 21 U.S.C. section 360bbb-3(b)(1), unless the authorization is terminated or revoked sooner.    Influenza A by PCR NEGATIVE NEGATIVE Final   Influenza B by PCR NEGATIVE NEGATIVE Final    Comment: (NOTE) The Xpert  Xpress SARS-CoV-2/FLU/RSV assay is intended as an aid in  the diagnosis of influenza from Nasopharyngeal swab specimens and  should not be used as a sole basis for treatment. Nasal washings and  aspirates are unacceptable for Xpert Xpress SARS-CoV-2/FLU/RSV  testing. Fact Sheet for Patients: https://www.moore.com/ Fact Sheet for Healthcare Providers: https://www.young.biz/ This test is not yet approved or cleared by the [place] FDA and  has been authorized for detection and/or diagnosis of SARS-CoV-2 by  FDA under an Emergency Use Authorization (EUA). This EUA will remain  in effect (meaning this test can be used) for the duration of the  Covid-19 declaration under Section 564(b)(1) of the Act, 21  U.S.C. section 360bbb-3(b)(1), unless the authorization is  terminated or revoked. Performed at [facility], [address] [place], [place] [address]      Labs: Basic Metabolic Panel: Recent Labs  Lab 06/04/19 1520 06/04/19 1558  NA 136 135  K 3.7 4.6  CL 101 103  CO2 23  --   GLUCOSE 127* 105*  BUN 10 13  CREATININE 1.13* 0.90  CALCIUM 9.2  --    Liver Function Tests: No results for input(s): AST, ALT, ALKPHOS, BILITOT, PROT, ALBUMIN in the last 168  hours. No results for input(s): LIPASE, AMYLASE in the last 168 hours. No results for input(s): AMMONIA in the last 168 hours. CBC: Recent Labs  Lab 06/04/19 1520 06/04/19 1558  WBC 10.5  --   NEUTROABS 8.8*  --   HGB 15.5* 14.6  HCT 46.3* 43.0  MCV 105.2*  --   PLT 171  --    Cardiac Enzymes: No results for input(s): CKTOTAL, CKMB, CKMBINDEX, TROPONINI in the last 168 hours. BNP: BNP (last 3 results) No results for input(s): BNP in the last 8760 hours.  ProBNP (last 3 results) No results for input(s): PROBNP in the last 8760 hours.  CBG: No results for input(s): GLUCAP in the last 168 hours.     Signed:  Briant Cedar, MD [facility] 06/05/2019, 3:53 PM

## 2019-06-05 NOTE — Evaluation (Signed)
Physical Therapy Evaluation Patient Details Name: [name] MRN: [id number] DOB: 09/30/1960 Today's Date: 06/05/2019   History of Present Illness  Pt is a 59 y.o. female admitted 06/04/19 after fall (approximately 2 days ago) sustaining distal R radius and ulna fxs; CIWA protocol. S/p R wrist ORIF 4/27. PMH includes bipolar disorder, alcohol abuse, tobacco abuse, fibromyalgia, remote seizure, gait disturbance; pt reports using RW since recent fall/trauma (~9 mo ago per chart) sustaining pelvic fxs.  Clinical Impression  Pt presents with an overall decrease in functional mobility secondary to above impairments as well as pain and generalized weakness. Educ on precautions, positioning, therex, and importance of mobility. Pt was able to maintain precautions during session with minimal cueing. Today, pt able to complete bed mobility, transfers, amb short distances with supervision<>min(A). Discussed recommendation for SNF or home health, Pt refused both and requested no follow up care. Pt would benefit from continued acute PT services to maximize functional mobility and independence prior to d/c home.       Follow Up Recommendations SNF;Supervision for mobility/OOB(Pt refused all follow up care.)    Equipment Recommendations  None recommended by PT(Pt has all equipment at home.)    Recommendations for Other Services       Precautions / Restrictions Precautions Precautions: Fall;Other (comment) Precaution Comments: Tremulous; CIWA Required Braces or Orthoses: Splint/Cast Splint/Cast: RUE Restrictions Weight Bearing Restrictions: Yes RUE Weight Bearing: Weight bear through elbow only      Mobility  Bed Mobility Overal bed mobility: Needs Assistance Bed Mobility: Supine to Sit     Supine to sit: Supervision     General bed mobility comments: Pt supervision for bed mobility. Pt educated on NWB precautions (R) UE, Pt did not require renforcement for bed  mobility.  Transfers Overall transfer level: Needs assistance Equipment used: 1 person hand held assist Transfers: Sit to/from Stand Sit to Stand: Min assist;Mod assist         General transfer comment: Pt required Min (A) sit to stand from bed, Pt required Mod(A) sit to stand from chair for safety, increased stability. Pt demonstrated increased tremors with standing, stand pivot transfer.  Ambulation/Gait Ambulation/Gait assistance: Min assist Gait Distance (Feet): 8 Feet Assistive device: Quad cane Gait Pattern/deviations: Step-to pattern;Decreased stride length;Trunk flexed     General Gait Details: Pt required min(A) for safety, stability due to increased tremors with amb, utalized quad cane upon Pt request post refusal of platform walker.  Stairs            Wheelchair Mobility    Modified Rankin (Stroke Patients Only)       Balance Overall balance assessment: Needs assistance Sitting-balance support: Single extremity supported Sitting balance-Leahy Scale: Fair Sitting balance - Comments: Pt required LUE support.   Standing balance support: Single extremity supported Standing balance-Leahy Scale: Poor Standing balance comment: Pt required hand held assist LUE standing, amb due to tremors, unsteadiness.                             Pertinent Vitals/Pain Pain Assessment: Faces Faces Pain Scale: Hurts little more Pain Location: RUE Pain Descriptors / Indicators: Grimacing Pain Intervention(s): Monitored during session;Repositioned    Home Living Family/patient expects to be discharged to:: Private residence Living Arrangements: Alone Available Help at Discharge: Family;Friend(s);Available 24 hours/day(Pt reports family available 24 hours a day) Type of Home: House Home Access: Stairs to enter Entrance Stairs-Rails: Can reach both Entrance Stairs-Number of Steps: 3 Home  Layout: One level Home Equipment: Walker - 2 wheels;Bedside  commode;Wheelchair - manual;Cane - quad Additional Comments: pt reports she has full equipment for home.    Prior Function Level of Independence: Independent with assistive device(s)         Comments: Pt reports fall due to improper use of RW, Pt reports her house is currently cluttered due to painting and she has recently been [employer] furniture to negotiate home environment.     Hand Dominance   Dominant Hand: Left    Extremity/Trunk Assessment   Upper Extremity Assessment Upper Extremity Assessment: Generalized weakness;RUE deficits/detail RUE Deficits / Details: s/p wrist ORIF. Elbow/shoulder motion limited by soreness. Swollen bruised fingers. RUE: Unable to fully assess due to immobilization    Lower Extremity Assessment Lower Extremity Assessment: Generalized weakness    Cervical / Trunk Assessment Cervical / Trunk Assessment: Kyphotic  Communication   Communication: No difficulties  Cognition Arousal/Alertness: Suspect due to medications Behavior During Therapy: Flat affect Overall Cognitive Status: No family/caregiver present to determine baseline cognitive functioning                                 General Comments: Pt reports feeling "loopy" having just received pain medication. Frusterated with having to answer same questions multiple times. Following simple commands appropriately, but poor attention noted.      General Comments General comments (skin integrity, edema, etc.): No abnormal notable skin integrity concerns, RUE edema.    Exercises     Assessment/Plan    PT Assessment Patient needs continued PT services  PT Problem List Decreased mobility;Decreased balance;Decreased knowledge of use of DME       PT Treatment Interventions DME instruction;Therapeutic activities;Gait training;Patient/family education;Stair training;Functional mobility training;Balance training;Therapeutic exercise    PT Goals (Current goals can be found in  the Care Plan section)  Acute Rehab PT Goals Patient Stated Goal: Return home PT Goal Formulation: With patient Time For Goal Achievement: 06/10/19 Potential to Achieve Goals: Good    Frequency Min 5X/week   Barriers to discharge Inaccessible home environment Pt reports her house is cluttered with boxes due to painter. Pt reports this is the reason she has been unable to use AD at home and has had to use furniture for home moblity. Pt reports her friend is going to clear out the boxes.    Co-evaluation               AM-PAC PT "6 Clicks" Mobility  Outcome Measure Help needed turning from your back to your side while in a flat bed without using bedrails?: None Help needed moving from lying on your back to sitting on the side of a flat bed without using bedrails?: None Help needed moving to and from a bed to a chair (including a wheelchair)?: A Little Help needed standing up from a chair using your arms (e.g., wheelchair or bedside chair)?: A Lot Help needed to walk in hospital room?: A Little Help needed climbing 3-5 steps with a railing? : A Lot(Educated on stair negotiation, safe family member roles) 6 Click Score: 18    End of Session   Activity Tolerance: Treatment limited secondary to agitation Patient left: in chair;with call bell/phone within reach;Other (comment)(No box for chair alarm in room, nursing notified.) Nurse Communication: Mobility status PT Visit Diagnosis: Other abnormalities of gait and mobility (R26.89);Muscle weakness (generalized) (M62.81);History of falling (Z91.81)    Time: 1610-9604 PT Time Calculation (  min) (ACUTE ONLY): 24 min   Charges:   PT Evaluation $PT Eval Moderate Complexity: 1 Mod PT Treatments $Therapeutic Activity: 8-22 mins      [employer] SPT 06/05/2019   Sanjuana Letters 06/05/2019, 11:40 AM

## 2019-06-05 NOTE — Evaluation (Signed)
Occupational Therapy Evaluation Patient Details Name: [name] MRN: [id number] DOB: 14-Oct-1960 Today's Date: 06/05/2019    History of Present Illness Pt is a 59 y.o. female admitted 06/04/19 after fall (approximately 2 days ago) sustaining distal R radius and ulna fxs; CIWA protocol. S/p R wrist ORIF 4/27. PMH includes bipolar disorder, alcohol abuse, tobacco abuse, fibromyalgia, remote seizure, gait disturbance; pt reports using RW since recent fall/trauma (~9 mo ago per chart) sustaining pelvic fxs.   Clinical Impression   PTA patient modified independent using RW with ADLs, mobility. Admitted for above and limited by problem list below, including R UE pain and decreased functional use/NWB, impaired balance, decreased activity tolerance, generalized weakness and impaired cognition (decreased safety awareness, attention and problem solving).  Patient currently requires min-mod assist for ADLs, min assist for stand pivot transfers to [facility], min cueing to adhere to R UE NWB precautions.  Patient educated on R UE exercises, use of elevation and ice, and safety.  She will benefit from further OT services while admitted to optimize independence and safety with ADLs, mobility, and recommend continued [facility] services at dc but patient likely to refuse.  Will follow.      Follow Up Recommendations  Supervision/Assistance - 24 hour;Home health OT;Follow surgeon's recommendation for DC plan and follow-up therapies(pt to likely refuse HH services, therefore f/u with MD recs )    Equipment Recommendations  None recommended by OT    Recommendations for Other Services       Precautions / Restrictions Precautions Precautions: Fall;Other (comment) Precaution Comments: Tremulous; CIWA Required Braces or Orthoses: Splint/Cast Splint/Cast: RUE Restrictions Weight Bearing Restrictions: Yes RUE Weight Bearing: Weight bear through elbow only Other Position/Activity Restrictions: verbal order per Dr. Amanda Pea       Mobility Bed Mobility Overal bed mobility: Needs Assistance Bed Mobility: Supine to Sit     Supine to sit: Supervision     General bed mobility comments: OOB in recliner upon entry   Transfers Overall transfer level: Needs assistance Equipment used: 1 person hand held assist Transfers: Sit to/from [employer] Sit to Stand: Min assist Stand pivot transfers: Min assist       General transfer comment: min assist to power up and steady from recliner, pivoting to/from [facility] with support for balance and safety, cueing for technique     Balance Overall balance assessment: Needs assistance Sitting-balance support: Single extremity supported Sitting balance-Leahy Scale: Fair Sitting balance - Comments: good dynamic balance seated managing socks in recliner    Standing balance support: Single extremity supported;During functional activity Standing balance-Leahy Scale: Poor Standing balance comment: relaint on 1 UE and external support                            ADL either performed or assessed with clinical judgement   ADL Overall ADL's : Needs assistance/impaired     Grooming: Minimal assistance;Sitting   Upper Body Bathing: Moderate assistance;Sitting   Lower Body Bathing: Minimal assistance;Sit to/from stand   Upper Body Dressing : Moderate assistance;Sitting   Lower Body Dressing: Minimal assistance;Sit to/from stand Lower Body Dressing Details (indicate cue type and reason): able to don/doff socks with increased time, min assist to manage clothing over hips and maintain balance Toilet Transfer: Minimal assistance;Stand-pivot;Cueing for safety;Cueing for sequencing;BSC   Toileting- Clothing Manipulation and Hygiene: Minimal assistance;Sit to/from stand       Functional mobility during ADLs: Minimal assistance;Cueing for safety;Cueing for sequencing General ADL Comments:  pt limited by weakness, impaired balance, pain and decreased  functional use of R UE      Vision         Perception     Praxis      Pertinent Vitals/Pain Pain Assessment: Faces Faces Pain Scale: Hurts little more Pain Location: RUE Pain Descriptors / Indicators: Grimacing Pain Intervention(s): Monitored during session;Repositioned;Ice applied     Hand Dominance Left   Extremity/Trunk Assessment Upper Extremity Assessment Upper Extremity Assessment: Generalized weakness;RUE deficits/detail RUE Deficits / Details: s/p wrist ORIF. Elbow/shoulder motion limited by soreness. Swollen bruised fingers. Able to wiggle fingers and raise arm from shoulder with AAROM RUE: Unable to fully assess due to pain;Unable to fully assess due to immobilization RUE Sensation: WNL RUE Coordination: decreased fine motor;decreased gross motor   Lower Extremity Assessment Lower Extremity Assessment: Generalized weakness   Cervical / Trunk Assessment Cervical / Trunk Assessment: Kyphotic   Communication Communication Communication: No difficulties   Cognition Arousal/Alertness: Awake/alert Behavior During Therapy: Flat affect;Anxious Overall Cognitive Status: No family/caregiver present to determine baseline cognitive functioning Area of Impairment: Attention;Problem solving;Awareness;Memory                   Current Attention Level: Sustained Memory: Decreased short-term memory     Awareness: Emergent Problem Solving: Difficulty sequencing;Requires verbal cues General Comments: cueing for problem sovling and safety awareness    General Comments  educated on exercises to R UE, elevation, and use of ice     Exercises     Shoulder Instructions      Home Living Family/patient expects to be discharged to:: Private residence Living Arrangements: Alone Available Help at Discharge: Family;Friend(s);Available 24 hours/day(reports family available 24 hours /day ) Type of Home: House Home Access: Stairs to enter [employer] of  Steps: 3 Entrance Stairs-Rails: Can reach both Home Layout: One level     Bathroom Shower/Tub: Occupational psychologist: Handicapped height Bathroom Accessibility: Yes   Home Equipment: Environmental consultant - 2 wheels;Bedside commode;Wheelchair - manual;Cane - quad;Grab bars - tub/shower   Additional Comments: pt reports she has full equipment for home.      Prior Functioning/Environment Level of Independence: Independent with assistive device(s)        Comments: reports independent with ADLs, reports fall due to improper use of RW         OT Problem List: Decreased strength;Decreased range of motion;Decreased activity tolerance;Impaired balance (sitting and/or standing);Decreased coordination;Decreased cognition;Decreased safety awareness;Decreased knowledge of use of DME or AE;Decreased knowledge of precautions;Pain;Impaired UE functional use;Increased edema      OT Treatment/Interventions: Self-care/ADL training;DME and/or AE instruction;Therapeutic exercise;Therapeutic activities;Cognitive remediation/compensation;Patient/family education;Balance training    OT Goals(Current goals can be found in the care plan section) Acute Rehab OT Goals Patient Stated Goal: Return home, less pain  OT Goal Formulation: With patient Time For Goal Achievement: 06/19/19 Potential to Achieve Goals: Good  OT Frequency: Min 2X/week   Barriers to D/C:            Co-evaluation              AM-PAC OT "6 Clicks" Daily Activity     Outcome Measure Help from another person eating meals?: A Little Help from another person taking care of personal grooming?: A Little Help from another person toileting, which includes using toliet, bedpan, or urinal?: A Little Help from another person bathing (including washing, rinsing, drying)?: A Lot Help from another person to put on and taking off regular upper body  clothing?: A Lot Help from another person to put on and taking off regular lower body  clothing?: A Little 6 Click Score: 16   End of Session Equipment Utilized During Treatment: Gait belt Nurse Communication: Mobility status;Precautions  Activity Tolerance: Patient tolerated treatment well Patient left: in chair;with call bell/phone within reach;with nursing/sitter in room;with family/visitor present  OT Visit Diagnosis: Pain;Muscle weakness (generalized) (M62.81);History of falling (Z91.81);Other abnormalities of gait and mobility (R26.89) Pain - Right/Left: Right Pain - part of body: Arm;Hand                Time: 4650-3546 OT Time Calculation (min): 25 min Charges:  OT General Charges $OT Visit: 1 Visit OT Evaluation $OT Eval Moderate Complexity: 1 Mod OT Treatments $Self Care/Home Management : 8-22 mins  Barry Brunner, OT Acute Rehabilitation Services Pager [phone] Office [phone]   Chancy Milroy 06/05/2019, 12:17 PM

## 2019-07-22 ENCOUNTER — Inpatient Hospital Stay: Payer: Self-pay | Admitting: Nurse Practitioner

## 2019-12-16 ENCOUNTER — Other Ambulatory Visit: Payer: Self-pay

## 2019-12-16 DIAGNOSIS — Z20822 Contact with and (suspected) exposure to covid-19: Secondary | ICD-10-CM

## 2019-12-17 LAB — NOVEL CORONAVIRUS, NAA: SARS-CoV-2, NAA: NOT DETECTED

## 2019-12-17 LAB — SARS-COV-2, NAA 2 DAY TAT

## 2019-12-19 ENCOUNTER — Other Ambulatory Visit: Payer: Self-pay

## 2019-12-19 ENCOUNTER — Ambulatory Visit (HOSPITAL_COMMUNITY)
Admission: EM | Admit: 2019-12-19 | Discharge: 2019-12-19 | Disposition: A | Discharge status: Home / Self Care | Payer: Self-pay | Attending: Urgent Care | Admitting: Urgent Care

## 2019-12-19 ENCOUNTER — Encounter (HOSPITAL_COMMUNITY): Payer: Self-pay

## 2019-12-19 DIAGNOSIS — M546 Pain in thoracic spine: Secondary | ICD-10-CM

## 2019-12-19 DIAGNOSIS — M898X1 Other specified disorders of bone, shoulder: Secondary | ICD-10-CM

## 2019-12-19 MED ORDER — TIZANIDINE HCL 4 MG PO TABS: 4.0000 mg | ORAL_TABLET | Freq: Three times a day (TID) | ORAL | 0 refills | Status: DC | PRN

## 2019-12-19 MED ORDER — TRAMADOL HCL 50 MG PO TABS: 50.0000 mg | ORAL_TABLET | Freq: Four times a day (QID) | ORAL | 0 refills | Status: DC | PRN

## 2019-12-19 NOTE — ED Triage Notes (Signed)
Pt reports having right scapula pain radiates under right breast x 2 weeks. Pt reports she fell forward over trash bags when taking the trash out 2 weeks ago. Denies chest pain, sob. Pt not taking medications for the complaints.

## 2019-12-19 NOTE — Discharge Instructions (Addendum)
Try taking aspirin for your pain. You can also use tizanidine. If you still have pain despite taking aspirin regularly, this is breakthrough pain.  You can use tramadol once every 6 hours for this.

## 2019-12-19 NOTE — ED Provider Notes (Signed)
[facility]   MRN: [id number] DOB: November 23, 1960  Subjective:   [name] is a 59 y.o. female presenting for 2-week history of persistent thoracic back pain along the scapular border.  Patient states that she accidentally fell forward onto a trash bag outside.  She did not make impact with anything but the trash bag.  And she was able to get up and has been able to move around but still has persistent sharp pains over her scapula.  Has not used any medications for relief except for an aspirin here and there.  Has a history of a DVT but is not on any anticoagulation.  Unfortunately due to the wait time, patient states that she cannot wait any longer for an x-ray.  She states that her ride needs to get to work in 20 minutes.  No current facility-administered medications for this encounter.  Current Outpatient Medications:  .  aspirin 325 MG tablet, Take 650 mg by mouth every 6 (six) hours as needed (for pain). , Disp: , Rfl:  .  CALCIUM PO, Take 1 tablet by mouth 2 (two) times a week., Disp: , Rfl:  .  ibuprofen (ADVIL) 800 MG tablet, Take 800 mg by mouth every 8 (eight) hours as needed (for pain)., Disp: , Rfl:  .  Multiple Vitamin (MULTIVITAMIN WITH MINERALS) TABS tablet, Take 1 tablet by mouth 3 (three) times a week. , Disp: , Rfl:    Allergies  Allergen Reactions  . Acetaminophen Other (See Comments)    Patient stated, " I overdosed on it."  . Compazine Swelling and Other (See Comments)    Convulsions/shaking and couldn't swallow anything    Past Medical History:  Diagnosis Date  . Anal fissure   . Anxiety   . Bipolar 1 disorder (HCC)   . Cerebral hemorrhage (HCC)   . Clotting disorder (HCC)    hx of blood clot  . Congenital single kidney   . Depression   . Endometriosis   . Fibromyalgia   . Hemorrhoids   . History of elevated homocysteine   . Seizures (HCC) 03/18/15  . 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 APPENDECTOMY    . BREAST SURGERY     benign breast lump removal  . CESAREAN SECTION    . LAPAROSCOPY     endometriosis  . LEEP  2000  . ORIF WRIST FRACTURE Right 06/04/2019  . ORIF WRIST FRACTURE Right 06/04/2019   Procedure: OPEN REDUCTION INTERNAL FIXATION (ORIF) WRIST FRACTURE;  Surgeon: Dominica Severin, MD;  Location: [facility] OR;  Service: Orthopedics;  Laterality: Right;  . TONSILLECTOMY    . TUBAL LIGATION      Family History  Problem Relation Age of Onset  . Heart disease Mother   . Cancer Sister        breast  . Heart disease Brother   . Coronary artery disease Other   . Stroke Other   . Hypertension Other     Social History   Tobacco Use  . Smoking status: Current Every Day Smoker    Packs/day: 1.00    Types: Cigarettes  . Smokeless tobacco: Never Used  Vaping Use  . Vaping Use: Never used  Substance Use Topics  . Alcohol use: Yes    Alcohol/week: 28.0 standard drinks    Types: 28 Cans of beer per week    Comment:  frequently   . Drug use: No    ROS   Objective:   Vitals: BP (!) 149/90 (BP Location: Right Arm)   Pulse 91   Temp 97.6 F (36.4 C) (Oral)   Resp 18   SpO2 98%   Physical Exam Constitutional:      General: She is not in acute distress.    Appearance: Normal appearance. She is well-developed. She is not ill-appearing, toxic-appearing or diaphoretic.  HENT:     Head: Normocephalic and atraumatic.     Nose: Nose normal.     Mouth/Throat:     Mouth: Mucous membranes are moist.     Pharynx: Oropharynx is clear.  Eyes:     General: No scleral icterus.       Right eye: No discharge.        Left eye: No discharge.     Extraocular Movements: Extraocular movements intact.     Conjunctiva/sclera: Conjunctivae normal.     Pupils: Pupils are equal, round, and reactive to light.  Cardiovascular:     Rate and Rhythm: Normal rate.  Pulmonary:     Effort: Pulmonary effort is normal.    Musculoskeletal:     Thoracic back: Spasms, tenderness (along area outlined) and bony tenderness present. No swelling, edema, deformity, signs of trauma or lacerations. Decreased range of motion. No scoliosis.       Back:  Skin:    General: Skin is warm and dry.  Neurological:     General: No focal deficit present.     Mental Status: She is alert and oriented to person, place, and time.  Psychiatric:        Mood and Affect: Mood normal.        Behavior: Behavior normal.        Thought Content: Thought content normal.        Judgment: Judgment normal.     Assessment and Plan :   PDMP not reviewed this encounter.  1. Pain of right scapula   2. Acute right-sided thoracic back pain     Unfortunately, patient could not wait for the x-ray. Recommended conservative management. Aspirin, muscle relaxant, tramadol for breakthrough pain. Counseled patient on potential for adverse effects with medications prescribed/recommended today, ER and return-to-clinic precautions discussed, patient verbalized understanding.    Wallis Bamberg, PA-C 12/19/19 1340

## 2019-12-30 ENCOUNTER — Ambulatory Visit (INDEPENDENT_AMBULATORY_CARE_PROVIDER_SITE_OTHER): Payer: Self-pay | Admitting: Family Medicine

## 2019-12-30 ENCOUNTER — Encounter: Payer: Self-pay | Admitting: Family Medicine

## 2019-12-30 ENCOUNTER — Other Ambulatory Visit: Payer: Self-pay

## 2019-12-30 VITALS — BP 120/84 | HR 92 | Temp 97.7°F | Ht 62.0 in | Wt 104.8 lb

## 2019-12-30 DIAGNOSIS — H6121 Impacted cerumen, right ear: Secondary | ICD-10-CM

## 2019-12-30 DIAGNOSIS — F411 Generalized anxiety disorder: Secondary | ICD-10-CM

## 2019-12-30 DIAGNOSIS — S29019D Strain of muscle and tendon of unspecified wall of thorax, subsequent encounter: Secondary | ICD-10-CM

## 2019-12-30 MED ORDER — LORAZEPAM 1 MG PO TABS
1.0000 mg | ORAL_TABLET | Freq: Four times a day (QID) | ORAL | 0 refills | Status: DC | PRN
Start: 2019-12-30 — End: 2020-06-19

## 2019-12-30 MED ORDER — TIZANIDINE HCL 4 MG PO TABS: 4.0000 mg | ORAL_TABLET | Freq: Four times a day (QID) | ORAL | 0 refills | Status: DC | PRN

## 2019-12-30 NOTE — Progress Notes (Signed)
° °  Subjective:    Patient ID: [name], female    DOB: [birth year], 59 y.o.   MRN: [id number]  HPI Here for several issues. First she has had muffled hearing in the right ear for about 3 weeks. No pain. No sinus congestion. Second she complains of pain in the right upper back for the past few weeks. She tripped over a garbage bag and fell on 12-19-19 outside her house. She went to Urgent Care for this and she was given a few Tizanidine. These helped a lot but she ran out of them. Also she asks for a refill on Lorazepam because her "nerves are shot". She has been dealing with a lot of anxiety lately.    Review of Systems  Constitutional: Negative.   HENT: Positive for hearing loss. Negative for congestion, ear discharge, ear pain and sinus pressure.   Eyes: Negative.   Respiratory: Negative.   Cardiovascular: Negative.   Musculoskeletal: Positive for back pain.  Psychiatric/Behavioral: Negative for dysphoric mood. The patient is nervous/anxious.        Objective:   Physical Exam Constitutional:      General: She is not in acute distress.    Appearance: Normal appearance.  HENT:     Right Ear: External ear normal.     Left Ear: Tympanic membrane, ear canal and external ear normal.     Ears:     Comments: Right canal is full of cerumen     Nose: Nose normal.     Mouth/Throat:     Pharynx: Oropharynx is clear.  Eyes:     Conjunctiva/sclera: Conjunctivae normal.  Cardiovascular:     Rate and Rhythm: Normal rate and regular rhythm.     Pulses: Normal pulses.     Heart sounds: Normal heart sounds.  Pulmonary:     Effort: Pulmonary effort is normal.     Breath sounds: Normal breath sounds.  Musculoskeletal:     Cervical back: Normal range of motion. No rigidity.     Comments: She is tender in the right upper back. The spine and right shoulder have full ROM   Lymphadenopathy:     Cervical: No cervical adenopathy.  Neurological:     Mental Status: She is alert.  Psychiatric:         Thought Content: Thought content normal.     Comments: She is very anxious            Assessment & Plan:  Cerumen impaction, irrigated clear with water. She seems to have strained some back muscles, so she can use heat and Tizanidine as needed. For the anxiety, we refilled the Lorazepam.  Gershon Crane, MD  Gershon Crane, MD

## 2020-01-19 ENCOUNTER — Other Ambulatory Visit: Payer: Self-pay

## 2020-01-19 ENCOUNTER — Emergency Department (HOSPITAL_COMMUNITY)
Admission: EM | Admit: 2020-01-19 | Discharge: 2020-01-20 | Disposition: A | Discharge status: Home / Self Care | Payer: Self-pay | Attending: Emergency Medicine | Admitting: Emergency Medicine

## 2020-01-19 ENCOUNTER — Encounter (HOSPITAL_COMMUNITY): Payer: Self-pay | Admitting: Emergency Medicine

## 2020-01-19 DIAGNOSIS — F17210 Nicotine dependence, cigarettes, uncomplicated: Secondary | ICD-10-CM | POA: Insufficient documentation

## 2020-01-19 DIAGNOSIS — L03115 Cellulitis of right lower limb: Secondary | ICD-10-CM

## 2020-01-19 DIAGNOSIS — Z7982 Long term (current) use of aspirin: Secondary | ICD-10-CM | POA: Insufficient documentation

## 2020-01-19 DIAGNOSIS — M79604 Pain in right leg: Secondary | ICD-10-CM | POA: Insufficient documentation

## 2020-01-19 NOTE — ED Triage Notes (Signed)
Patient reports insect bite at right thigh onset 2 weeks ago , mild redness/no drainage , denies fever or chills .

## 2020-01-20 MED ORDER — CLINDAMYCIN HCL 150 MG PO CAPS
300.0000 mg | ORAL_CAPSULE | Freq: Once | ORAL | Status: AC
  Administered 2020-01-20: 300 mg via ORAL
  Filled 2020-01-20: qty 2

## 2020-01-20 MED ORDER — CLINDAMYCIN HCL 300 MG PO CAPS
300.0000 mg | ORAL_CAPSULE | Freq: Four times a day (QID) | ORAL | 0 refills | Status: DC
Start: 2020-01-20 — End: 2020-01-30

## 2020-01-20 NOTE — ED Notes (Signed)
Patient verbalizes understanding of discharge instructions. Opportunity for questioning and answers were provided. Armband removed by staff, pt discharged from ED via wheelchair.  

## 2020-01-20 NOTE — Discharge Instructions (Signed)
Begin taking clindamycin as prescribed.  Apply warm compresses as frequently as possible the next several days.  Take ibuprofen 600 mg every 6 hours as needed for pain.  Return to the emergency department for increased swelling, increased pain, high fevers, or other new and concerning symptoms.

## 2020-01-20 NOTE — ED Provider Notes (Signed)
[facility] EMERGENCY DEPARTMENT Provider Note   CSN: [id number] Arrival date & time: 01/19/20  2251     History Chief Complaint  Patient presents with  . Insect Bite    [name] is a 59 y.o. female.  Patient is a 59 year old female with history of anxiety, bipolar, seizures.  She presents today for evaluation of right leg pain.  Patient started several days ago with a red, swollen area.  This has increased in size and now has a dark-colored center.  Patient denies fevers or chills.  She is uncertain as to whether she was bitten by something.  The history is provided by the patient.       Past Medical History:  Diagnosis Date  . Anal fissure   . Anxiety   . Bipolar 1 disorder (HCC)   . Cerebral hemorrhage (HCC)   . Clotting disorder (HCC)    hx of blood clot  . Congenital single kidney   . Depression   . Endometriosis   . Fibromyalgia   . Hemorrhoids   . History of elevated homocysteine   . Seizures (HCC) 03/18/15  . 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 Right wrist fracture 06/04/2019  . Fall at home, initial encounter 06/04/2019  . Alcohol abuse 06/04/2019  . AKI (acute kidney injury) (HCC) 06/04/2019  . Tobacco abuse 06/04/2019  . Alcoholic ketoacidosis 08/12/2018  . Pubic ramus fracture, left, closed, initial encounter (HCC) 08/12/2018  . Hyponatremia 08/12/2018  . Gait disturbance 08/12/2018  . Fibromyalgia 08/12/2018  . Acute alcohol intoxication (HCC) 08/12/2018  . Anal fissure 03/02/2011  . LACERATION, SCALP 08/25/2008  . Anxiety state 05/23/2008  . EPIGASTRIC PAIN 05/23/2008  . DEEP VENOUS THROMBOPHLEBITIS, HX OF 11/14/2006  . EMBOLISM/THROMBOSIS, DEEP VSL PRXML LWR EXTRM 08/28/2006    Past Surgical History:  Procedure Laterality Date  . APPENDECTOMY    . BREAST SURGERY     benign breast lump removal  . CESAREAN SECTION    .  LAPAROSCOPY     endometriosis  . LEEP  2000  . ORIF WRIST FRACTURE Right 06/04/2019  . ORIF WRIST FRACTURE Right 06/04/2019   Procedure: OPEN REDUCTION INTERNAL FIXATION (ORIF) WRIST FRACTURE;  Surgeon: Dominica Severin, MD;  Location: [facility] OR;  Service: Orthopedics;  Laterality: Right;  . TONSILLECTOMY    . TUBAL LIGATION       OB History   No obstetric history on file.     Family History  Problem Relation Age of Onset  . Heart disease Mother   . Cancer Sister        breast  . Heart disease Brother   . Coronary artery disease Other   . Stroke Other   . Hypertension Other     Social History   Tobacco Use  . Smoking status: Current Every Day Smoker    Packs/day: 1.00    Types: Cigarettes  . Smokeless tobacco: Never Used  Vaping Use  . Vaping Use: Never used  Substance Use Topics  . Alcohol use: Yes    Alcohol/week: 28.0 standard drinks    Types: 28 Cans of beer per week    Comment: frequently   . Drug use: No    Home Medications Prior to Admission medications   Medication Sig Start Date End Date Taking? Authorizing Provider  aspirin 325 MG tablet Take 650 mg by mouth every  6 (six) hours as needed (for pain).     [provider]  CALCIUM PO Take 1 tablet by mouth 2 (two) times a week.    [provider]  ibuprofen (ADVIL) 800 MG tablet Take 800 mg by mouth every 8 (eight) hours as needed (for pain).    [provider]  LORazepam (ATIVAN) 1 MG tablet Take 1 tablet (1 mg total) by mouth every 6 (six) hours as needed for anxiety. 12/30/19   Nelwyn Salisbury, MD  Multiple Vitamin (MULTIVITAMIN WITH MINERALS) TABS tablet Take 1 tablet by mouth 3 (three) times a week.     [provider]  tiZANidine (ZANAFLEX) 4 MG tablet Take 1 tablet (4 mg total) by mouth every 6 (six) hours as needed. 12/30/19   Nelwyn Salisbury, MD  traMADol (ULTRAM) 50 MG tablet Take 1 tablet (50 mg total) by mouth every 6 (six) hours as needed for severe pain. Patient not  taking: Reported on 12/30/2019 12/19/19   Wallis Bamberg, PA-C    Allergies    Acetaminophen and Compazine  Review of Systems   Review of Systems  All other systems reviewed and are negative.   Physical Exam Updated Vital Signs BP (!) 157/106   Pulse (!) 115   Temp 98.1 F (36.7 C) (Oral)   Resp 20   Ht 5\' 1"  (1.549 m)   Wt 55 kg   SpO2 99%   BMI 22.91 kg/m   Physical Exam Vitals and nursing note reviewed.  Constitutional:      General: She is not in acute distress.    Appearance: Normal appearance. She is not ill-appearing.  HENT:     Head: Normocephalic and atraumatic.  Pulmonary:     Effort: Pulmonary effort is normal.  Skin:    General: Skin is warm and dry.     Comments: To the anterior aspect of the right thigh, there is a 2 cm x 3 cm indurated, erythematous area.  There is a central area of dark-colored tissue, but no fluctuance or purulent drainage.  Neurological:     Mental Status: She is alert.     ED Results / Procedures / Treatments   Labs (all labs ordered are listed, but only abnormal results are displayed) Labs Reviewed - No data to display  EKG None  Radiology No results found.  Procedures Procedures (including critical care time)  Medications Ordered in ED Medications  clindamycin (CLEOCIN) capsule 300 mg (has no administration in time range)    ED Course  I have reviewed the triage vital signs and the nursing notes.  Pertinent labs & imaging results that were available during my care of the patient were reviewed by me and considered in my medical decision making (see chart for details).    MDM Rules/Calculators/A&P  Patient appears to have a small area of cellulitis/possible early abscess.  I do not feel as though I&D is indicated at this time as I palpate no fluctuance.  Patient will be treated with warm compresses and clindamycin.  She is to return as needed for any problems.  Final Clinical Impression(s) / ED Diagnoses Final  diagnoses:  None    Rx / DC Orders ED Discharge Orders    None       , MD 01/20/20 [phone]

## 2020-01-20 NOTE — ED Notes (Signed)
ED Provider at bedside. 

## 2020-01-23 ENCOUNTER — Telehealth: Payer: Self-pay | Admitting: Family Medicine

## 2020-01-23 ENCOUNTER — Other Ambulatory Visit: Payer: Self-pay

## 2020-01-23 ENCOUNTER — Emergency Department (HOSPITAL_COMMUNITY)
Admission: EM | Admit: 2020-01-23 | Discharge: 2020-01-23 | Disposition: A | Discharge status: Home / Self Care | Payer: Self-pay | Attending: Emergency Medicine | Admitting: Emergency Medicine

## 2020-01-23 DIAGNOSIS — L723 Sebaceous cyst: Secondary | ICD-10-CM | POA: Insufficient documentation

## 2020-01-23 DIAGNOSIS — L729 Follicular cyst of the skin and subcutaneous tissue, unspecified: Secondary | ICD-10-CM

## 2020-01-23 DIAGNOSIS — X58XXXA Exposure to other specified factors, initial encounter: Secondary | ICD-10-CM | POA: Insufficient documentation

## 2020-01-23 DIAGNOSIS — F17210 Nicotine dependence, cigarettes, uncomplicated: Secondary | ICD-10-CM | POA: Insufficient documentation

## 2020-01-23 DIAGNOSIS — Z7982 Long term (current) use of aspirin: Secondary | ICD-10-CM | POA: Insufficient documentation

## 2020-01-23 LAB — CBC WITH DIFFERENTIAL/PLATELET
Abs Immature Granulocytes: 0.02 10*3/uL (ref 0.00–0.07)
Basophils Absolute: 0.1 10*3/uL (ref 0.0–0.1)
Basophils Relative: 1 %
Eosinophils Absolute: 0.2 10*3/uL (ref 0.0–0.5)
Eosinophils Relative: 3 %
HCT: 39 % (ref 36.0–46.0)
Hemoglobin: 12.7 g/dL (ref 12.0–15.0)
Immature Granulocytes: 0 %
Lymphocytes Relative: 18 %
Lymphs Abs: 1.1 10*3/uL (ref 0.7–4.0)
MCH: 33.9 pg (ref 26.0–34.0)
MCHC: 32.6 g/dL (ref 30.0–36.0)
MCV: 104 fL — ABNORMAL HIGH (ref 80.0–100.0)
Monocytes Absolute: 0.6 10*3/uL (ref 0.1–1.0)
Monocytes Relative: 10 %
Neutro Abs: 4.1 10*3/uL (ref 1.7–7.7)
Neutrophils Relative %: 68 %
Platelets: 206 10*3/uL (ref 150–400)
RBC: 3.75 MIL/uL — ABNORMAL LOW (ref 3.87–5.11)
RDW: 14.1 % (ref 11.5–15.5)
WBC: 6.1 10*3/uL (ref 4.0–10.5)
nRBC: 0 % (ref 0.0–0.2)

## 2020-01-23 LAB — COMPREHENSIVE METABOLIC PANEL
ALT: 16 U/L (ref 0–44)
AST: 26 U/L (ref 15–41)
Albumin: 3.6 g/dL (ref 3.5–5.0)
Alkaline Phosphatase: 90 U/L (ref 38–126)
Anion gap: 13 (ref 5–15)
BUN: 7 mg/dL (ref 6–20)
CO2: 22 mmol/L (ref 22–32)
Calcium: 9.2 mg/dL (ref 8.9–10.3)
Chloride: 99 mmol/L (ref 98–111)
Creatinine, Ser: 1.12 mg/dL — ABNORMAL HIGH (ref 0.44–1.00)
GFR, Estimated: 57 mL/min — ABNORMAL LOW (ref >60)
Glucose, Bld: 107 mg/dL — ABNORMAL HIGH (ref 70–99)
Potassium: 2.9 mmol/L — ABNORMAL LOW (ref 3.5–5.1)
Sodium: 134 mmol/L — ABNORMAL LOW (ref 135–145)
Total Bilirubin: 0.5 mg/dL (ref 0.3–1.2)
Total Protein: 6.6 g/dL (ref 6.5–8.1)

## 2020-01-23 LAB — LACTIC ACID, PLASMA: Lactic Acid, Venous: 2.7 mmol/L (ref 0.5–1.9)

## 2020-01-23 MED ORDER — DOXYCYCLINE HYCLATE 100 MG PO CAPS
100.0000 mg | ORAL_CAPSULE | Freq: Two times a day (BID) | ORAL | 0 refills | Status: DC
Start: 2020-01-23 — End: 2020-01-30

## 2020-01-23 NOTE — ED Provider Notes (Signed)
[facility] EMERGENCY DEPARTMENT Provider Note   CSN: [id number] Arrival date & time: 01/23/20  1108     History No chief complaint on file.   [name] is a 59 y.o. female.  59 yo F with a chief complaint of a wound to the right thigh.  This been going on for about a week now.  Was seen in the ED and started on clindamycin.  States that she has been taking the medication as prescribed.  She denies any fevers.  Denies any specific worsening but feels like it is not significantly improved.  She has been applying bacitracin.  The history is provided by the patient.  Illness Severity:  Moderate Onset quality:  Gradual Duration:  2 days Timing:  Constant Progression:  Worsening Chronicity:  New Associated symptoms: no chest pain, no congestion, no fever, no headaches, no myalgias, no nausea, no rhinorrhea, no shortness of breath, no vomiting and no wheezing        Past Medical History:  Diagnosis Date  . Anal fissure   . Anxiety   . Bipolar 1 disorder (HCC)   . Cerebral hemorrhage (HCC)   . Clotting disorder (HCC)    hx of blood clot  . Congenital single kidney   . Depression   . Endometriosis   . Fibromyalgia   . Hemorrhoids   . History of elevated homocysteine   . Seizures (HCC) 03/18/15  . 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 Right wrist fracture 06/04/2019  . Fall at home, initial encounter 06/04/2019  . Alcohol abuse 06/04/2019  . AKI (acute kidney injury) (HCC) 06/04/2019  . Tobacco abuse 06/04/2019  . Alcoholic ketoacidosis 08/12/2018  . Pubic ramus fracture, left, closed, initial encounter (HCC) 08/12/2018  . Hyponatremia 08/12/2018  . Gait disturbance 08/12/2018  . Fibromyalgia 08/12/2018  . Acute alcohol intoxication (HCC) 08/12/2018  . Anal fissure 03/02/2011  . LACERATION, SCALP 08/25/2008  . Anxiety state 05/23/2008  . EPIGASTRIC PAIN  05/23/2008  . DEEP VENOUS THROMBOPHLEBITIS, HX OF 11/14/2006  . EMBOLISM/THROMBOSIS, DEEP VSL PRXML LWR EXTRM 08/28/2006    Past Surgical History:  Procedure Laterality Date  . APPENDECTOMY    . BREAST SURGERY     benign breast lump removal  . CESAREAN SECTION    . LAPAROSCOPY     endometriosis  . LEEP  2000  . ORIF WRIST FRACTURE Right 06/04/2019  . ORIF WRIST FRACTURE Right 06/04/2019   Procedure: OPEN REDUCTION INTERNAL FIXATION (ORIF) WRIST FRACTURE;  Surgeon: Dominica Severin, MD;  Location: [facility] OR;  Service: Orthopedics;  Laterality: Right;  . TONSILLECTOMY    . TUBAL LIGATION       OB History   No obstetric history on file.     Family History  Problem Relation Age of Onset  . Heart disease Mother   . Cancer Sister        breast  . Heart disease Brother   . Coronary artery disease Other   . Stroke Other   . Hypertension Other     Social History   Tobacco Use  . Smoking status: Current Every Day Smoker    Packs/day: 1.00    Types: Cigarettes  . Smokeless tobacco: Never Used  Vaping Use  . Vaping Use: Never used  Substance Use Topics  . Alcohol use: Yes    Alcohol/week: 28.0 standard drinks  Types: 28 Cans of beer per week    Comment: frequently   . Drug use: No    Home Medications Prior to Admission medications   Medication Sig Start Date End Date Taking? Authorizing Provider  aspirin 325 MG tablet Take 650 mg by mouth every 6 (six) hours as needed (for pain).     [provider]  CALCIUM PO Take 1 tablet by mouth 2 (two) times a week.    [provider]  clindamycin (CLEOCIN) 300 MG capsule Take 1 capsule (300 mg total) by mouth 4 (four) times daily. X 7 days 01/20/20   Geoffery Lyons, MD  doxycycline (VIBRAMYCIN) 100 MG capsule Take 1 capsule (100 mg total) by mouth 2 (two) times daily. One po bid x 7 days 01/23/20   Melene Plan, DO  ibuprofen (ADVIL) 800 MG tablet Take 800 mg by mouth every 8 (eight) hours as needed (for pain).     [provider]  LORazepam (ATIVAN) 1 MG tablet Take 1 tablet (1 mg total) by mouth every 6 (six) hours as needed for anxiety. 12/30/19   Nelwyn Salisbury, MD  Multiple Vitamin (MULTIVITAMIN WITH MINERALS) TABS tablet Take 1 tablet by mouth 3 (three) times a week.     [provider]  tiZANidine (ZANAFLEX) 4 MG tablet Take 1 tablet (4 mg total) by mouth every 6 (six) hours as needed. 12/30/19   Nelwyn Salisbury, MD  traMADol (ULTRAM) 50 MG tablet Take 1 tablet (50 mg total) by mouth every 6 (six) hours as needed for severe pain. Patient not taking: Reported on 12/30/2019 12/19/19   Wallis Bamberg, PA-C    Allergies    Acetaminophen and Compazine  Review of Systems   Review of Systems  Constitutional: Negative for chills and fever.  HENT: Negative for congestion and rhinorrhea.   Eyes: Negative for redness and visual disturbance.  Respiratory: Negative for shortness of breath and wheezing.   Cardiovascular: Negative for chest pain and palpitations.  Gastrointestinal: Negative for nausea and vomiting.  Genitourinary: Negative for dysuria and urgency.  Musculoskeletal: Negative for arthralgias and myalgias.  Skin: Positive for wound. Negative for pallor.  Neurological: Negative for dizziness and headaches.    Physical Exam Updated Vital Signs BP 123/88 (BP Location: Left Arm)   Pulse 90   Temp 98.7 F (37.1 C) (Oral)   Resp 18   Ht 5\' 1"  (1.549 m)   Wt 47.2 kg   SpO2 100%   BMI 19.65 kg/m   Physical Exam Vitals and nursing note reviewed.  Constitutional:      General: She is not in acute distress.    Appearance: She is well-developed and well-nourished. She is not diaphoretic.  HENT:     Head: Normocephalic and atraumatic.  Eyes:     Extraocular Movements: EOM normal.     Pupils: Pupils are equal, round, and reactive to light.  Cardiovascular:     Rate and Rhythm: Normal rate and regular rhythm.     Heart sounds: No murmur heard. No friction rub. No gallop.    Pulmonary:     Effort: Pulmonary effort is normal.     Breath sounds: No wheezing or rales.  Abdominal:     General: There is no distension.     Palpations: Abdomen is soft.     Tenderness: There is no abdominal tenderness.  Musculoskeletal:        General: No tenderness or edema.     Cervical back: Normal range of  motion and neck supple.     Comments: Localized area of induration approximately quarter sized with a sebaceous core.  Expressed without any obvious purulence.  No surrounding erythema.  Skin:    General: Skin is warm and dry.  Neurological:     Mental Status: She is alert and oriented to person, place, and time.  Psychiatric:        Mood and Affect: Mood and affect normal.        Behavior: Behavior normal.     ED Results / Procedures / Treatments   Labs (all labs ordered are listed, but only abnormal results are displayed) Labs Reviewed  LACTIC ACID, PLASMA - Abnormal; Notable for the following components:      Result Value   Lactic Acid, Venous 2.7 (*)    All other components within normal limits  COMPREHENSIVE METABOLIC PANEL - Abnormal; Notable for the following components:   Sodium 134 (*)    Potassium 2.9 (*)    Glucose, Bld 107 (*)    Creatinine, Ser 1.12 (*)    GFR, Estimated 57 (*)    All other components within normal limits  CBC WITH DIFFERENTIAL/PLATELET - Abnormal; Notable for the following components:   RBC 3.75 (*)    MCV 104.0 (*)    All other components within normal limits  LACTIC ACID, PLASMA  URINALYSIS, ROUTINE W REFLEX MICROSCOPIC    EKG None  Radiology No results found.  Procedures .Marland Kitchen.Incision and Drainage  Date/Time: 01/23/2020 4:09 PM Performed by: Melene PlanFloyd, Delmer Kowalski, DO Authorized by: Melene PlanFloyd, Melanye Hiraldo, DO   Consent:    Consent obtained:  Verbal   Consent given by:  Patient   Risks, benefits, and alternatives were discussed: yes     Risks discussed:  Bleeding and infection   Alternatives discussed:  No treatment, delayed treatment  and alternative treatment Universal protocol:    Immediately prior to procedure, a time out was called: yes     Patient identity confirmed:  Verbally with patient Location:    Type:  Abscess   Location:  Lower extremity   Lower extremity location:  Leg   Leg location:  R upper leg Sedation:    Sedation type:  None Anesthesia:    Anesthesia method:  None Procedure type:    Complexity:  Simple Procedure details:    Ultrasound guidance: no     Needle aspiration: no     Wound management:  Probed and deloculated   Drainage amount:  Scant   Wound treatment:  Wound left open   Packing materials:  None Post-procedure details:    Procedure completion:  Tolerated well, no immediate complications Comments:     Expressed the core of the patient's sebaceous gland.  No obvious purulence.   (including critical care time)  Medications Ordered in ED Medications - No data to display  ED Course  I have reviewed the triage vital signs and the nursing notes.  Pertinent labs & imaging results that were available during my care of the patient were reviewed by me and considered in my medical decision making (see chart for details).    MDM Rules/Calculators/A&P                          59 yo F with a chief complaints of wound to her right thigh.  Going on for the past week.  Not improving despite antibiotics.  Not specifically worsening.  Patient is afebrile here well-appearing nontoxic.  No  significant area of erythema.  We will change the patient's antibiotics.  Have her abstain from bacitracin in case she has an intolerance.  Warm compresses.  PCP follow-up.  4:10 PM:  I have discussed the diagnosis/risks/treatment options with the patient and believe the pt to be eligible for discharge home to follow-up with PCP. We also discussed returning to the ED immediately if new or worsening sx occur. We discussed the sx which are most concerning (e.g., sudden worsening pain, fever, inability to tolerate  by mouth) that necessitate immediate return. Medications administered to the patient during their visit and any new prescriptions provided to the patient are listed below.  Medications given during this visit Medications - No data to display   The patient appears reasonably screen and/or stabilized for discharge and I doubt any other medical condition or other [facility] requiring further screening, evaluation, or treatment in the ED at this time prior to discharge.   Final Clinical Impression(s) / ED Diagnoses Final diagnoses:  Infected cyst of skin    Rx / DC Orders ED Discharge Orders         Ordered    doxycycline (VIBRAMYCIN) 100 MG capsule  2 times daily        01/23/20 1605           Melene Plan, DO 01/23/20 1610

## 2020-01-23 NOTE — ED Triage Notes (Signed)
Pt returns for eval of wound on R thigh. Seen for same on Sunday and dx with cellulitis or possible early abscess. Given Clindamycin and has taken twice but has not improved. Arrives with toilet paper taped over the wound with painters tape. Denies fevers, chills, n/v.

## 2020-01-23 NOTE — Discharge Instructions (Signed)
Get a washcloth and get it wet and put in the microwave make sure is stuck in a burn you and then hold on there for 10 to 15 minutes at a time at least 4 times a day.  This should keep this open and keep it draining.  He may have an intolerance to bacitracin.  Try not to apply this to your skin anymore.  Take your antibiotics as prescribed.  Please return for rapid spreading redness or if you develop a fever.  Follow-up with your family doctor in the office.

## 2020-01-23 NOTE — Telephone Encounter (Signed)
error 

## 2020-01-23 NOTE — ED Notes (Signed)
DC instructions and scrips reviewed with pt. Pt verbalized understanding.  PT DC.  

## 2020-01-24 ENCOUNTER — Ambulatory Visit: Payer: Self-pay | Admitting: Family Medicine

## 2020-01-30 ENCOUNTER — Other Ambulatory Visit: Payer: Self-pay

## 2020-01-30 ENCOUNTER — Ambulatory Visit: Payer: Self-pay | Admitting: Family Medicine

## 2020-01-30 ENCOUNTER — Encounter: Payer: Self-pay | Admitting: Family Medicine

## 2020-01-30 VITALS — BP 128/82 | HR 84 | Temp 97.9°F | Ht 61.0 in | Wt 105.0 lb

## 2020-01-30 DIAGNOSIS — L02429 Furuncle of limb, unspecified: Secondary | ICD-10-CM

## 2020-01-30 MED ORDER — DOXYCYCLINE HYCLATE 100 MG PO CAPS: 100.0000 mg | ORAL_CAPSULE | Freq: Two times a day (BID) | ORAL | 0 refills | Status: AC

## 2020-01-30 MED ORDER — IBUPROFEN 800 MG PO TABS: 800.0000 mg | ORAL_TABLET | Freq: Four times a day (QID) | ORAL | 2 refills | Status: DC | PRN

## 2020-01-30 NOTE — Progress Notes (Signed)
   Subjective:    Patient ID: [name], female    DOB: [birth year], 59 y.o.   MRN: [id number]  HPI Here to follow up on two ER visits for a painful area on the right thigh. During the first visit on 01-19-20 she was given Clindamycin, thinking this may have been the result of a spider bite. On the second visit on 01-23-20 she was given 7 days of Doxycycline, which she finished yesterday. This seems to have worked better. The area is smaller now and less painful.    Review of Systems  Constitutional: Negative.   Respiratory: Negative.   Cardiovascular: Negative.   Skin: Positive for wound.       Objective:   Physical Exam Constitutional:      Appearance: Normal appearance.  Cardiovascular:     Rate and Rhythm: Normal rate and regular rhythm.     Pulses: Normal pulses.     Heart sounds: Normal heart sounds.  Pulmonary:     Effort: Pulmonary effort is normal.     Breath sounds: Normal breath sounds.  Skin:    Comments: The anterior right thigh has a macular area of erythema which is tender, but not warm. There is a rim of scaling around the perimeter, indicating the area has been healing.   Neurological:     Mental Status: She is alert.           Assessment & Plan:  This is most likely a boil. We will give her 7 more days of Doxycycline. Recheck as needed . Gershon Crane, MD

## 2020-03-13 ENCOUNTER — Telehealth: Payer: Self-pay | Admitting: Family Medicine

## 2020-03-13 MED ORDER — ALBUTEROL SULFATE HFA 108 (90 BASE) MCG/ACT IN AERS
2.0000 | INHALATION_SPRAY | RESPIRATORY_TRACT | 5 refills | Status: DC | PRN
Start: 2020-03-13 — End: 2021-11-16

## 2020-03-13 NOTE — Telephone Encounter (Signed)
I sent this in

## 2020-03-13 NOTE — Telephone Encounter (Signed)
Medication is currently on the medication list

## 2020-03-13 NOTE — Telephone Encounter (Signed)
Pt is calling in to get a refill on Rx albuterol inhaler  Pharm:  [facility] [phone]  Pt was transferred to the triage nurse due to her having a hard time breathing.

## 2020-04-10 ENCOUNTER — Telehealth (INDEPENDENT_AMBULATORY_CARE_PROVIDER_SITE_OTHER): Payer: Self-pay | Admitting: Family Medicine

## 2020-04-10 ENCOUNTER — Encounter: Payer: Self-pay | Admitting: Family Medicine

## 2020-04-10 DIAGNOSIS — N390 Urinary tract infection, site not specified: Secondary | ICD-10-CM

## 2020-04-10 MED ORDER — CIPROFLOXACIN HCL 500 MG PO TABS
500.0000 mg | ORAL_TABLET | Freq: Two times a day (BID) | ORAL | 0 refills | Status: DC
Start: 2020-04-10 — End: 2021-02-04

## 2020-04-10 NOTE — Progress Notes (Signed)
   Subjective:    Patient ID: [name], female    DOB: May 18, 1960, 60 y.o.   MRN: [id number]  HPI Virtual Visit via Telephone Note  I connected with the patient on 04/10/20 at  3:30 PM EST by telephone and verified that I am speaking with the correct person using two identifiers.   I discussed the limitations, risks, security and privacy concerns of performing an evaluation and management service by telephone and the availability of in person appointments. I also discussed with the patient that there may be a patient responsible charge related to this service. The patient expressed understanding and agreed to proceed.  Location patient: home Location provider: work or home office Participants present for the call: patient, provider Patient did not have a visit in the prior 7 days to address this/these issue(s).   History of Present Illness: Here for 3 days of vaginal burning, burning with urinations, and urgency to urinate. No fever or back pain. She has mild pain in the lower abdomen. No odor or DC.    Observations/Objective: Patient sounds cheerful and well on the phone. I do not appreciate any SOB. Speech and thought processing are grossly intact. Patient reported vitals:  Assessment and Plan: UTI, treat with Cipro for 7 days. Drink plenty of water.  Gershon Crane, MD   Follow Up Instructions:     [phone] 5-10 [phone] 11-20 [phone] 21-30 I did not refer this patient for an OV in the next 24 hours for this/these issue(s).  I discussed the assessment and treatment plan with the patient. The patient was provided an opportunity to ask questions and all were answered. The patient agreed with the plan and demonstrated an understanding of the instructions.   The patient was advised to call back or seek an in-person evaluation if the symptoms worsen or if the condition fails to improve as anticipated.  I provided 10 minutes of non-face-to-face time during this  encounter.   Gershon Crane, MD    Review of Systems     Objective:   Physical Exam        Assessment & Plan:

## 2020-04-10 NOTE — Progress Notes (Signed)
   Subjective:    Patient ID: [name], female    DOB: 12/14/1960, 60 y.o.   MRN: [id number]  HPI    Review of Systems     Objective:   Physical Exam        Assessment & Plan:

## 2020-06-19 ENCOUNTER — Other Ambulatory Visit: Payer: Self-pay | Admitting: Family Medicine

## 2020-06-19 NOTE — Telephone Encounter (Signed)
Last office visit- 04/10/20 Last refill- 12/30/19--120 tabs no refills  No future appointment scheduled

## 2021-01-20 ENCOUNTER — Other Ambulatory Visit: Payer: Self-pay | Admitting: Family Medicine

## 2021-01-21 NOTE — Telephone Encounter (Signed)
Spoke with pt scheduled appointment for 01/25/2021 at 4 pm

## 2021-01-25 ENCOUNTER — Telehealth: Payer: Self-pay | Admitting: Family Medicine

## 2021-01-29 ENCOUNTER — Other Ambulatory Visit: Payer: Self-pay | Admitting: Family Medicine

## 2021-02-04 ENCOUNTER — Encounter: Payer: Self-pay | Admitting: Family Medicine

## 2021-02-04 ENCOUNTER — Telehealth (INDEPENDENT_AMBULATORY_CARE_PROVIDER_SITE_OTHER): Payer: Self-pay | Admitting: Family Medicine

## 2021-02-04 DIAGNOSIS — F411 Generalized anxiety disorder: Secondary | ICD-10-CM

## 2021-02-04 DIAGNOSIS — M797 Fibromyalgia: Secondary | ICD-10-CM

## 2021-02-04 MED ORDER — LORAZEPAM 1 MG PO TABS: ORAL_TABLET | ORAL | 5 refills | Status: DC

## 2021-02-04 MED ORDER — IBUPROFEN 800 MG PO TABS: 800.0000 mg | ORAL_TABLET | Freq: Four times a day (QID) | ORAL | 5 refills | Status: AC | PRN

## 2021-02-04 MED ORDER — TIZANIDINE HCL 4 MG PO TABS: 4.0000 mg | ORAL_TABLET | Freq: Four times a day (QID) | ORAL | 5 refills | Status: DC | PRN

## 2021-02-04 NOTE — Progress Notes (Signed)
° °  Subjective:    Patient ID: [name], female    DOB: 1960-12-21, 60 y.o.   MRN: [id number]  HPI Virtual Visit via Telephone Note  I connected with the patient on 02/04/21 at 11:00 AM EST by telephone and verified that I am speaking with the correct person using two identifiers.   I discussed the limitations, risks, security and privacy concerns of performing an evaluation and management service by telephone and the availability of in person appointments. I also discussed with the patient that there may be a patient responsible charge related to this service. The patient expressed understanding and agreed to proceed.  Location patient: home Location provider: work or home office Participants present for the call: patient, provider Patient did not have a visit in the prior 7 days to address this/these issue(s).   History of Present Illness: Here for medication refills. She is doing well in general. Her fibromyalgia and anxiety are stable but she still requires treatment for these.    Observations/Objective: Patient sounds cheerful and well on the phone. I do not appreciate any SOB. Speech and thought processing are grossly intact. Patient reported vitals:  Assessment and Plan: Fibromyalgia and anxiety are stable. Medications are refilled.  Gershon Crane, MD   Follow Up Instructions:     [phone] 5-10 [phone] 11-20 [phone] 21-30 I did not refer this patient for an OV in the next 24 hours for this/these issue(s).  I discussed the assessment and treatment plan with the patient. The patient was provided an opportunity to ask questions and all were answered. The patient agreed with the plan and demonstrated an understanding of the instructions.   The patient was advised to call back or seek an in-person evaluation if the symptoms worsen or if the condition fails to improve as anticipated.  I provided 18 minutes of non-face-to-face time during this encounter.   Gershon Crane, MD      Review of Systems     Objective:   Physical Exam        Assessment & Plan:

## 2021-04-14 IMAGING — CT CT PELVIS WITHOUT CONTRAST
2 of 5 series · 16 of 46 positions shown, 18 images · non-contrast
Comparison: 08/12/2018

CLINICAL DATA: Mechanical fall with hip and groin pain

EXAM:
CT PELVIS WITHOUT CONTRAST
TECHNIQUE: Multidetector CT imaging of the pelvis was performed following the
standard protocol without intravenous contrast.

[Series 3: soft tissue · axial · 0.81mm/px · z∈[-371,-166]mm · 13 of 49 slices shown, 15 images]
[im 4/49  soft-tissue]
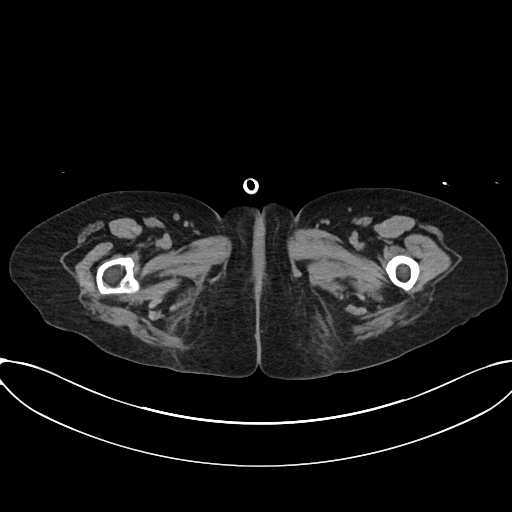
[im 4/49  bone]
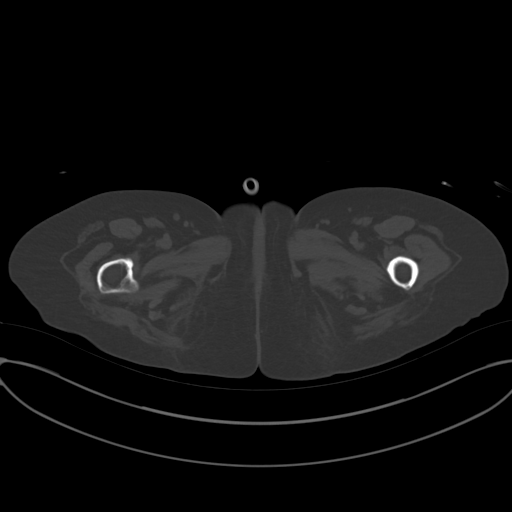
[im 7/49  soft-tissue]
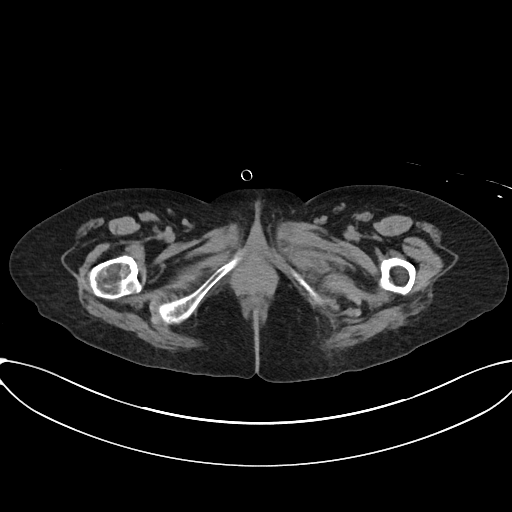
[im 10/49  soft-tissue]
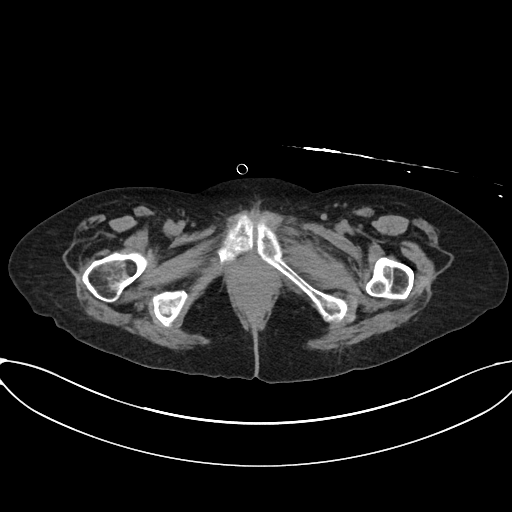
[im 14/49  soft-tissue]
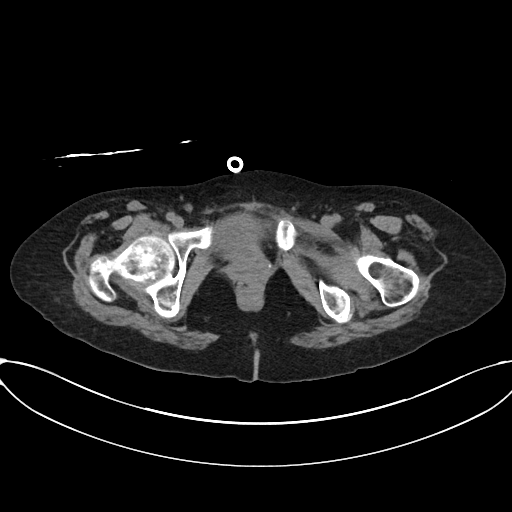
[im 18/49  soft-tissue]
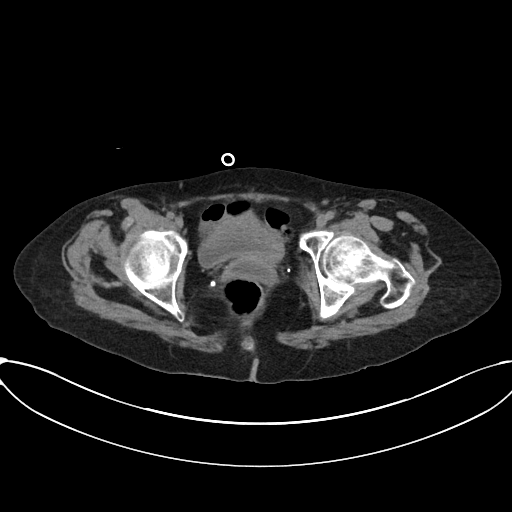
[im 21/49  soft-tissue]
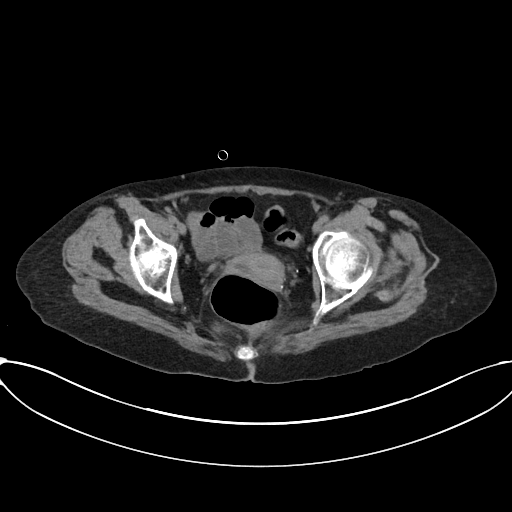
[im 25/49  soft-tissue]
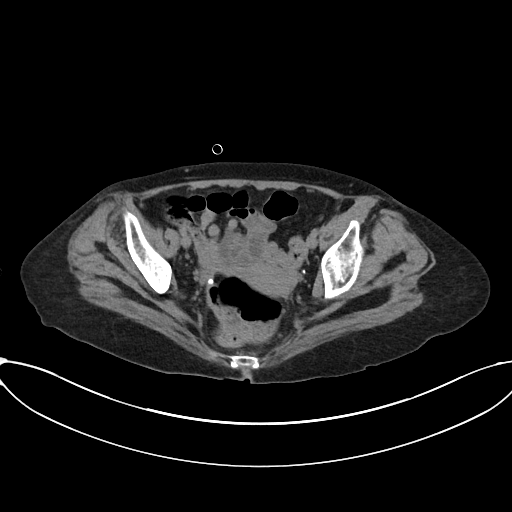
[im 28/49  soft-tissue]
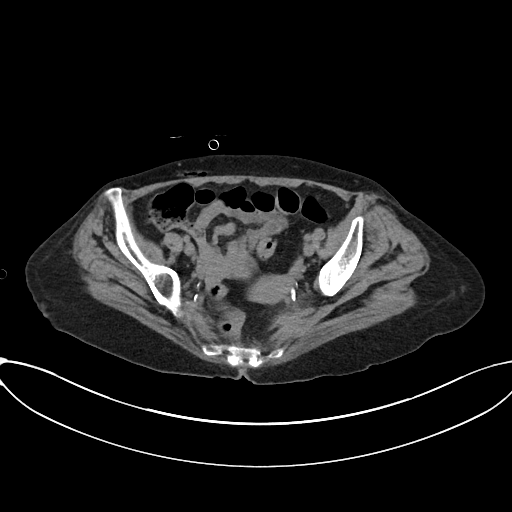
[im 31/49  soft-tissue]
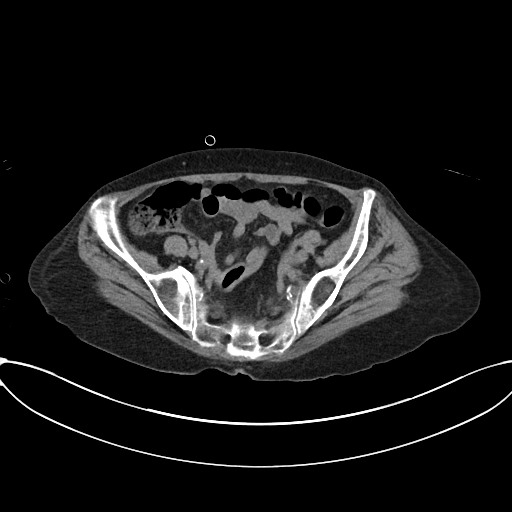
[im 31/49  bone]
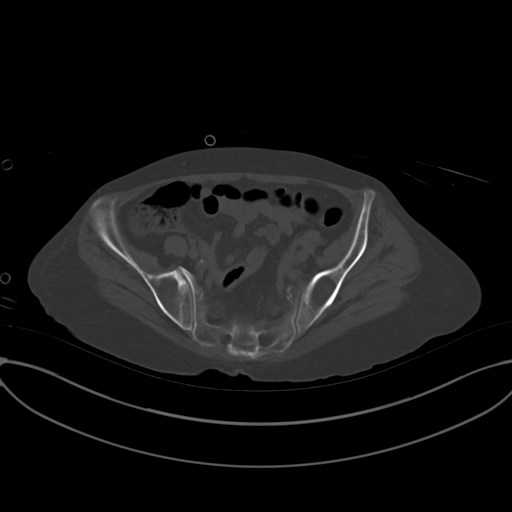
[im 35/49  soft-tissue]
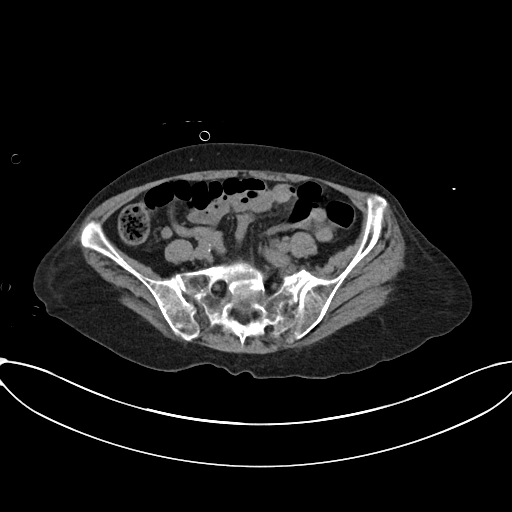
[im 39/49  soft-tissue]
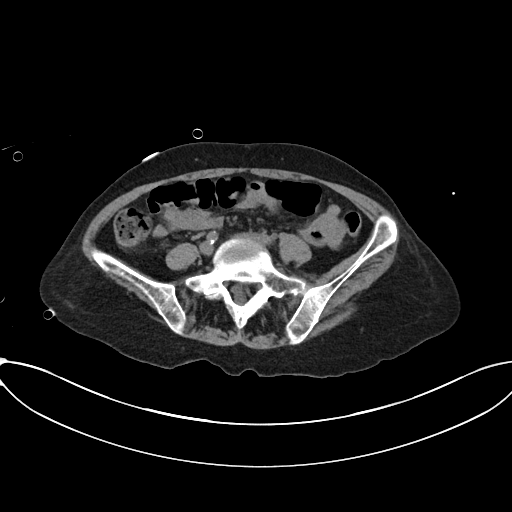
[im 42/49  soft-tissue]
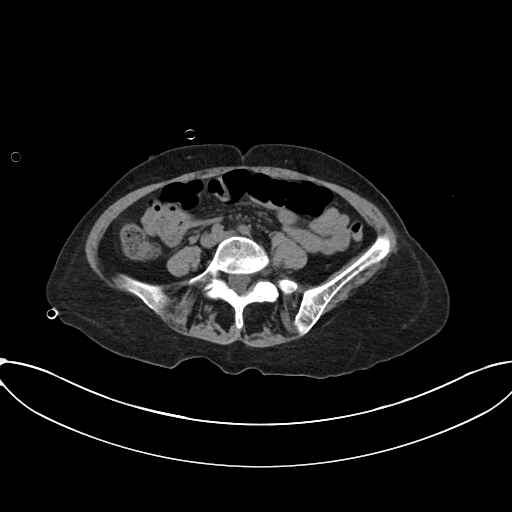
[im 45/49  soft-tissue]
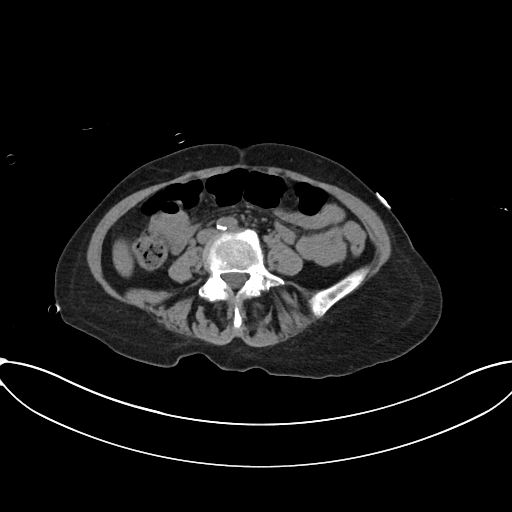

[Series 5: cor st · coronal · 0.58mm/px · 3 of 92 slices shown]
[im 23/92  soft-tissue]
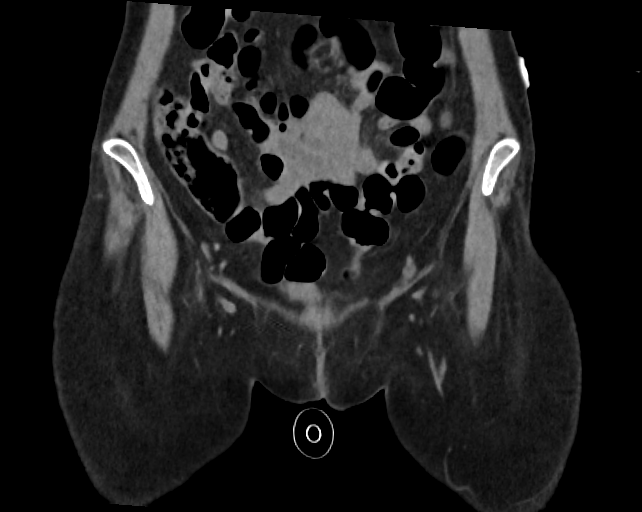
[im 46/92  soft-tissue]
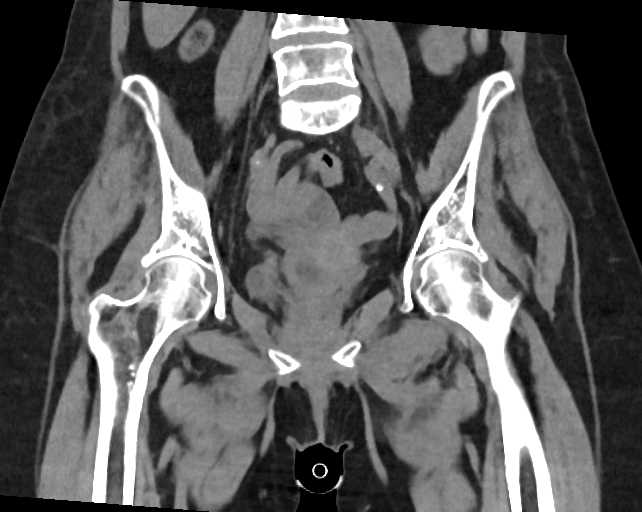
[im 69/92  soft-tissue]
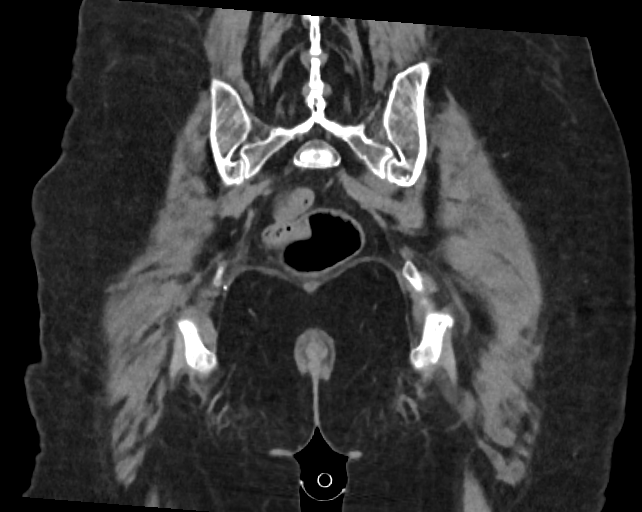

[16 of 46 positions shown; findings below may reference images not displayed]

FINDINGS: Urinary Tract: Slightly thick-walled appearance of the urinary
bladder, incompletely distended.

Bowel:  Unremarkable visualized pelvic bowel loops.

Vascular/Lymphatic: Moderate aortic atherosclerosis without
aneurysm. No significantly enlarged lymph nodes

Reproductive: Moderate fluid within the vagina. Uterus otherwise
unremarkable. No adnexal mass

Other:  No significant pelvic effusion

Musculoskeletal: Acute nondisplaced left inferior pubic ramus
fracture. Acute mildly displaced left superior pubic ramus fracture
best seen on coronal views. Fracture lucency extends to the left
pubic symphysis but there is no widening of the symphysis. Acute,
mildly comminuted left sacral ala fracture without widening of the
left SI joint. Both proximal femurs appear intact.
IMPRESSION: 1. Acute nondisplaced left inferior pubic ramus fracture and acute
mildly displaced superior pubic ramus fracture with extension of
fracture lucency to the pubic symphysis but no widening of the
symphysis
2. Acute mildly comminuted left sacral ala fracture
3. Thick-walled appearance of urinary bladder, either due to under
distension or possible cystitis
4. Moderate fluid in the vagina. Recommend correlation with direct
inspection

## 2021-08-11 ENCOUNTER — Other Ambulatory Visit: Payer: Self-pay | Admitting: Family Medicine

## 2021-08-12 NOTE — Telephone Encounter (Signed)
Last VV- 02/04/21 Last refill- 02/04/21-120 tabs, 5 refills  No future OV scheduled.

## 2021-08-12 NOTE — Telephone Encounter (Signed)
Done

## 2021-09-12 ENCOUNTER — Encounter (HOSPITAL_COMMUNITY): Payer: Self-pay | Admitting: Emergency Medicine

## 2021-09-12 ENCOUNTER — Ambulatory Visit (HOSPITAL_COMMUNITY)
Admission: EM | Admit: 2021-09-12 | Discharge: 2021-09-13 | Disposition: A | Discharge status: Home / Self Care | Payer: No Payment, Other | Attending: Psychiatry | Admitting: Psychiatry

## 2021-09-12 DIAGNOSIS — Z602 Problems related to living alone: Secondary | ICD-10-CM | POA: Insufficient documentation

## 2021-09-12 DIAGNOSIS — F10120 Alcohol abuse with intoxication, uncomplicated: Secondary | ICD-10-CM | POA: Diagnosis not present

## 2021-09-12 DIAGNOSIS — F319 Bipolar disorder, unspecified: Secondary | ICD-10-CM | POA: Insufficient documentation

## 2021-09-12 DIAGNOSIS — F10920 Alcohol use, unspecified with intoxication, uncomplicated: Secondary | ICD-10-CM

## 2021-09-12 DIAGNOSIS — R569 Unspecified convulsions: Secondary | ICD-10-CM | POA: Diagnosis not present

## 2021-09-12 DIAGNOSIS — Z20822 Contact with and (suspected) exposure to covid-19: Secondary | ICD-10-CM | POA: Insufficient documentation

## 2021-09-12 LAB — COMPREHENSIVE METABOLIC PANEL
ALT: 13 U/L (ref 0–44)
AST: 29 U/L (ref 15–41)
Albumin: 4.6 g/dL (ref 3.5–5.0)
Alkaline Phosphatase: 80 U/L (ref 38–126)
Anion gap: 18 — ABNORMAL HIGH (ref 5–15)
BUN: 6 mg/dL (ref 6–20)
CO2: 14 mmol/L — ABNORMAL LOW (ref 22–32)
Calcium: 9.3 mg/dL (ref 8.9–10.3)
Chloride: 97 mmol/L — ABNORMAL LOW (ref 98–111)
Creatinine, Ser: 0.94 mg/dL (ref 0.44–1.00)
GFR, Estimated: >60 mL/min (ref >60)
Glucose, Bld: 88 mg/dL (ref 70–99)
Potassium: 4.3 mmol/L (ref 3.5–5.1)
Sodium: 129 mmol/L — ABNORMAL LOW (ref 135–145)
Total Bilirubin: 0.7 mg/dL (ref 0.3–1.2)
Total Protein: 7.1 g/dL (ref 6.5–8.1)

## 2021-09-12 LAB — CBC WITH DIFFERENTIAL/PLATELET
Abs Immature Granulocytes: 0.05 10*3/uL (ref 0.00–0.07)
Basophils Absolute: 0.1 10*3/uL (ref 0.0–0.1)
Basophils Relative: 1 %
Eosinophils Absolute: 0.1 10*3/uL (ref 0.0–0.5)
Eosinophils Relative: 1 %
HCT: 38.6 % (ref 36.0–46.0)
Hemoglobin: 13.7 g/dL (ref 12.0–15.0)
Immature Granulocytes: 1 %
Lymphocytes Relative: 17 %
Lymphs Abs: 1.8 10*3/uL (ref 0.7–4.0)
MCH: 32.9 pg (ref 26.0–34.0)
MCHC: 35.5 g/dL (ref 30.0–36.0)
MCV: 92.6 fL (ref 80.0–100.0)
Monocytes Absolute: 0.4 10*3/uL (ref 0.1–1.0)
Monocytes Relative: 4 %
Neutro Abs: 8.2 10*3/uL — ABNORMAL HIGH (ref 1.7–7.7)
Neutrophils Relative %: 76 %
Platelets: 415 10*3/uL — ABNORMAL HIGH (ref 150–400)
RBC: 4.17 MIL/uL (ref 3.87–5.11)
RDW: 14.4 % (ref 11.5–15.5)
WBC: 10.6 10*3/uL — ABNORMAL HIGH (ref 4.0–10.5)
nRBC: 0 % (ref 0.0–0.2)

## 2021-09-12 LAB — LIPID PANEL
Cholesterol: 304 mg/dL — ABNORMAL HIGH (ref 0–200)
HDL: 88 mg/dL (ref >40)
LDL Cholesterol: 190 mg/dL — ABNORMAL HIGH (ref 0–99)
Total CHOL/HDL Ratio: 3.5 RATIO
Triglycerides: 129 mg/dL (ref <150)
VLDL: 26 mg/dL (ref 0–40)

## 2021-09-12 LAB — ETHANOL: Alcohol, Ethyl (B): 244 mg/dL — ABNORMAL HIGH (ref <10)

## 2021-09-12 LAB — RESP PANEL BY RT-PCR (FLU A&B, COVID) ARPGX2
Influenza A by PCR: NEGATIVE
Influenza B by PCR: NEGATIVE
SARS Coronavirus 2 by RT PCR: NEGATIVE

## 2021-09-12 LAB — HEMOGLOBIN A1C
Hgb A1c MFr Bld: 4.7 % — ABNORMAL LOW (ref 4.8–5.6)
Mean Plasma Glucose: 88.19 mg/dL

## 2021-09-12 LAB — MAGNESIUM: Magnesium: 2 mg/dL (ref 1.7–2.4)

## 2021-09-12 LAB — TSH: TSH: 1.174 u[IU]/mL (ref 0.350–4.500)

## 2021-09-12 MED ORDER — HYDROXYZINE HCL 25 MG PO TABS
25.0000 mg | ORAL_TABLET | Freq: Four times a day (QID) | ORAL | Status: DC | PRN
  Administered 2021-09-13: 25 mg via ORAL
  Filled 2021-09-12: qty 1

## 2021-09-12 MED ORDER — THIAMINE HCL 100 MG PO TABS: 100.0000 mg | ORAL_TABLET | Freq: Every day | ORAL | Status: DC

## 2021-09-12 MED ORDER — THIAMINE HCL 100 MG/ML IJ SOLN
100.0000 mg | Freq: Once | INTRAMUSCULAR | Status: AC
  Administered 2021-09-12: 100 mg via INTRAMUSCULAR
  Filled 2021-09-12: qty 2

## 2021-09-12 MED ORDER — ONDANSETRON 4 MG PO TBDP: 4.0000 mg | ORAL_TABLET | Freq: Four times a day (QID) | ORAL | Status: DC | PRN

## 2021-09-12 MED ORDER — MAGNESIUM HYDROXIDE 400 MG/5ML PO SUSP: 30.0000 mL | Freq: Every day | ORAL | Status: DC | PRN

## 2021-09-12 MED ORDER — LORAZEPAM 1 MG PO TABS
1.0000 mg | ORAL_TABLET | Freq: Four times a day (QID) | ORAL | Status: DC | PRN
  Administered 2021-09-12: 1 mg via ORAL
  Filled 2021-09-12: qty 1

## 2021-09-12 MED ORDER — ADULT MULTIVITAMIN W/MINERALS CH
1.0000 | ORAL_TABLET | Freq: Every day | ORAL | Status: DC
  Administered 2021-09-12: 1 via ORAL
  Filled 2021-09-12: qty 1

## 2021-09-12 MED ORDER — HYDROXYZINE HCL 25 MG PO TABS: 25.0000 mg | ORAL_TABLET | Freq: Three times a day (TID) | ORAL | Status: DC | PRN

## 2021-09-12 MED ORDER — LOPERAMIDE HCL 2 MG PO CAPS: 2.0000 mg | ORAL_CAPSULE | ORAL | Status: DC | PRN

## 2021-09-12 MED ORDER — ALUM & MAG HYDROXIDE-SIMETH 200-200-20 MG/5ML PO SUSP: 30.0000 mL | ORAL | Status: DC | PRN

## 2021-09-12 NOTE — ED Provider Notes (Signed)
[facility] Continuous Assessment Admission H&P  Date: 09/12/21 Patient Name: [name] MRN: [id number] Chief Complaint: No chief complaint on file.     Diagnoses:  Final diagnoses:  Acute alcoholic intoxication without complication (HCC)    HPI: [name] 61 y.o., female patient presented to [facility] as a walk in accompanied by [employer] with alcohol intoxication.  [name], 61 y.o., female patient seen face to face by this provider, consulted with Dr. Lucianne Muss; and chart reviewed on 09/12/21.  Chart review is limited.  However per note from [facility] patient has a past psychiatric history of bipolar 1 disorder and depression.  Patient is a poor historian and denies any psychiatric history.  Of note patient states she has had a seizure in the past.  The seizure was not related to alcohol withdrawal.  Patient states she is not prescribed any medications for seizure disorder.  Reports it has been years since she has had a seizure.  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  She lives alone.  She has 2 children whom she has little contact with.  On evaluation [name] reports today she drank "too many beers".  She was unable to recall the exact amount.  She is unable to state how often she drinks.  She denies any other substance use.  Patient states she feels happy when she should be sad and laughs when she should cry.  Her affect is very labile.  She is very animated. States, " I need to be fixed so I don't laugh all the time". She is visibly intoxicated.  Her speech is loud, pressured, and slurred.  She is disheveled and makes minimal eye contact.  She is alert/oriented x4 and redirectable.  Reports she has been unable to sleep for the past few days and has a decrease in appetite.  She reports being sad due to her brother having stage IV cancer.  She is unable to describe her depressed symptoms and has to be redirected.  She is easily distracted.  She denies SI/HI/AVH.   Objectively she does not appear to be responding to internal/external stimuli.  Discussed continuous assessment for overnight observation.  Patient is in agreement. Has no collateral to contact at this time.  She will be reevaluated by psychiatry in the a.m.   PHQ 2-9:     Total Time spent with patient: 30 minutes  Musculoskeletal  Strength & Muscle Tone: within normal limits Gait & Station: normal Patient leans: N/A  Psychiatric Specialty Exam  Presentation General Appearance: Disheveled  Eye Contact:Minimal  Speech:Slurred  Speech Volume:Increased  Handedness:Right   Mood and Affect  Mood:Labile  Affect:Congruent   Thought Process  Thought Processes:Coherent  Descriptions of Associations:Intact  Orientation:No data recorded Thought Content:Scattered    Hallucinations:Hallucinations: None  Ideas of Reference:None  Suicidal Thoughts:Suicidal Thoughts: No  Homicidal Thoughts:Homicidal Thoughts: No   Sensorium  Memory:Immediate Poor; Recent Poor; Remote Poor  Judgment:Poor  Insight:Lacking   Executive Functions  Concentration:Poor  Attention Span:Fair  Recall:Fair  Fund of Knowledge:Fair  Language:Fair   Psychomotor Activity  Psychomotor Activity:Psychomotor Activity: Normal   Assets  Assets:Physical Health; Resilience; Leisure Time   Sleep  Sleep:Sleep: Poor Number of Hours of Sleep: 0   Nutritional Assessment (For OBS and FBC admissions only) Has the patient had a weight loss or gain of 10 pounds or more in the last 3 months?: No Has the patient had a decrease in food intake/or appetite?: Yes Does the  patient have dental problems?: No Does the patient have eating habits or behaviors that may be indicators of an eating disorder including binging or inducing vomiting?: No Has the patient recently lost weight without trying?: 2.0 Has the patient been eating poorly because of a decreased appetite?: 1 Malnutrition Screening Tool  Score: 3    Physical Exam Vitals and nursing note reviewed.  Constitutional:      General: She is not in acute distress.    Appearance: Normal appearance. She is not ill-appearing.  HENT:     Head: Normocephalic.  Eyes:     General:        Right eye: No discharge.        Left eye: No discharge.     Conjunctiva/sclera: Conjunctivae normal.  Cardiovascular:     Rate and Rhythm: Normal rate.  Pulmonary:     Effort: Pulmonary effort is normal. No respiratory distress.  Musculoskeletal:        General: Normal range of motion.     Cervical back: Normal range of motion.  Skin:    Coloration: Skin is not jaundiced or pale.  Neurological:     Mental Status: She is alert and oriented to person, place, and time.  Psychiatric:        Attention and Perception: She is inattentive.        Mood and Affect: Mood is depressed. Affect is labile.        Speech: Speech is slurred.        Behavior: Behavior is cooperative.        Thought Content: Thought content normal.        Cognition and Memory: Cognition normal.        Judgment: Judgment normal.    Review of Systems  Constitutional: Negative.   HENT: Negative.    Eyes: Negative.   Respiratory: Negative.    Cardiovascular: Negative.   Gastrointestinal: Negative.   Genitourinary: Negative.   Musculoskeletal: Negative.   Skin: Negative.   Neurological: Negative.   Psychiatric/Behavioral:  Positive for depression and substance abuse.     Blood pressure (!) 145/96, pulse (!) 152, temperature 98.6 F (37 C), temperature source Oral, resp. rate 20, SpO2 96 %. There is no height or weight on file to calculate BMI.  Past Psychiatric History: Per [facility] note patient has a history of bipolar 1 disorder, depression, and alcohol use  Is the patient at risk to self? No  Has the patient been a risk to self in the past 6 months? No .    Has the patient been a risk to self within the distant past? No   Is the patient a risk to  others? No   Has the patient been a risk to others in the past 6 months? No   Has the patient been a risk to others within the distant past? 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    Past Surgical History:  Procedure Laterality Date  APPENDECTOMY     BREAST SURGERY     benign breast lump removal   CESAREAN SECTION     LAPAROSCOPY     endometriosis   LEEP  2000   ORIF WRIST FRACTURE Right 06/04/2019   ORIF WRIST FRACTURE Right 06/04/2019   Procedure: OPEN REDUCTION INTERNAL FIXATION (ORIF) WRIST FRACTURE;  Surgeon: Dominica SeverinGramig, William, MD;  Location: [facility] OR;  Service: Orthopedics;  Laterality: Right;   TONSILLECTOMY     TUBAL LIGATION      Family History:  Family History  Problem Relation Age of Onset   Heart disease Mother    Cancer Sister        breast   Heart disease Brother    Coronary artery disease Other    Stroke Other    Hypertension Other     Social History:  Social History   Socioeconomic History   Marital status: Divorced    Spouse name: Not on file   Number of children: 2   Years of education: 12   Highest education level: Not on file  Occupational History    Comment: home maker  Tobacco Use   Smoking status: Every Day    Packs/day: 1.00    Types: Cigarettes   Smokeless tobacco: Never  Vaping Use   Vaping Use: Never used  Substance and Sexual Activity   Alcohol use: Yes    Alcohol/week: 28.0 standard drinks of alcohol    Types: 28 Cans of beer per week    Comment: frequently    Drug use: No   Sexual activity: Not on file    Comment: unknown  Other Topics Concern    Not on file  Social History Narrative   Not on file   Social Determinants of Health   Financial Resource Strain: Not on file  Food Insecurity: Not on file  Transportation Needs: Not on file  Physical Activity: Not on file  Stress: Not on file  Social Connections: Not on file  Intimate Partner Violence: Not on file    SDOH:  SDOH Screenings   Alcohol Screen: Not on file  Depression (UUV2-5(PHQ2-9): Not on file  Financial Resource Strain: Not on file  Food Insecurity: Not on file  Housing: Not on file  Physical Activity: Not on file  Social Connections: Not on file  Stress: Not on file  Tobacco Use: High Risk (02/04/2021)   Patient History    Smoking Tobacco Use: Every Day    Smokeless Tobacco Use: Never    Passive Exposure: Not on file  Transportation Needs: Not on file    Last Labs:  No visits with results within 6 Month(s) from this visit.  Latest known visit with results is:  Admission on 01/23/2020, Discharged on 01/23/2020  Component Date Value Ref Range Status   Lactic Acid, Venous 01/23/2020 2.7 (HH)  0.5 - 1.9 mmol/L Final   Comment: CRITICAL RESULT CALLED TO, READ BACK BY AND VERIFIED WITHEricka Pontiff: CARLAN,C RN @ [phone] 01/23/20 LEOANRD,A Performed at [facility], [address] [place], [place] [address]    Sodium 01/23/2020 134 (L)  135 - 145 mmol/L Final   Potassium 01/23/2020 2.9 (L)  3.5 - 5.1 mmol/L Final   Chloride 01/23/2020 99  98 - 111 mmol/L Final   CO2 01/23/2020 22  22 - 32 mmol/L Final   Glucose, Bld 01/23/2020 107 (H)  70 - 99 mg/dL Final   Glucose reference range applies only to samples taken after fasting for at least 8  hours.   BUN 01/23/2020 7  6 - 20 mg/dL Final   Creatinine, Ser 01/23/2020 1.12 (H)  0.44 - 1.00 mg/dL Final   Calcium 12/04/2534 9.2  8.9 - 10.3 mg/dL Final   Total Protein 64/40/3474 6.6  6.5 - 8.1 g/dL Final   Albumin 25/95/6387 3.6  3.5 - 5.0 g/dL Final   AST 56/43/3295 26  15 - 41 U/L Final   ALT 01/23/2020 16  0 - 44 U/L  Final   Alkaline Phosphatase 01/23/2020 90  38 - 126 U/L Final   Total Bilirubin 01/23/2020 0.5  0.3 - 1.2 mg/dL Final   GFR, Estimated 01/23/2020 57 (L)  >60 mL/min Final   Comment: (NOTE) Calculated using the CKD-EPI Creatinine Equation (2021)    Anion gap 01/23/2020 13  5 - 15 Final   Performed at [facility], [address], [place], [place] [address]   WBC 01/23/2020 6.1  4.0 - 10.5 K/uL Final   RBC 01/23/2020 3.75 (L)  3.87 - 5.11 MIL/uL Final   Hemoglobin 01/23/2020 12.7  12.0 - 15.0 g/dL Final   HCT 66/07/3014 39.0  36.0 - 46.0 % Final   MCV 01/23/2020 104.0 (H)  80.0 - 100.0 fL Final   MCH 01/23/2020 33.9  26.0 - 34.0 pg Final   MCHC 01/23/2020 32.6  30.0 - 36.0 g/dL Final   RDW 02/15/3233 14.1  11.5 - 15.5 % Final   Platelets 01/23/2020 206  150 - 400 K/uL Final   nRBC 01/23/2020 0.0  0.0 - 0.2 % Final   Neutrophils Relative % 01/23/2020 68  % Final   Neutro Abs 01/23/2020 4.1  1.7 - 7.7 K/uL Final   Lymphocytes Relative 01/23/2020 18  % Final   Lymphs Abs 01/23/2020 1.1  0.7 - 4.0 K/uL Final   Monocytes Relative 01/23/2020 10  % Final   Monocytes Absolute 01/23/2020 0.6  0.1 - 1.0 K/uL Final   Eosinophils Relative 01/23/2020 3  % Final   Eosinophils Absolute 01/23/2020 0.2  0.0 - 0.5 K/uL Final   Basophils Relative 01/23/2020 1  % Final   Basophils Absolute 01/23/2020 0.1  0.0 - 0.1 K/uL Final   Immature Granulocytes 01/23/2020 0  % Final   Abs Immature Granulocytes 01/23/2020 0.02  0.00 - 0.07 K/uL Final   Performed at [facility], [address], [place], [place] [address]    Allergies: Acetaminophen and Compazine  PTA Medications: (Not in a hospital admission)   Medical Decision Making  Patient presents to [facility] with alcohol intoxication.  She lives alone and there is no collateral to contact.  She will be admitted to the continuous assessment unit for overnight observation she will be reevaluated by psychiatry in the a.m.    Recommendations   Based on my evaluation the patient does not appear to have an emergency medical condition.  Admit patient to the continuous assessment unit for overnight observation.  She will be reevaluated by psychiatry in the a.m.  CIWA protocol ordered   Lab Orders         Resp Panel by RT-PCR (Flu A&B, Covid) Anterior Nasal Swab         CBC with Differential/Platelet         Comprehensive metabolic panel         Hemoglobin A1c         Magnesium         Ethanol         Lipid panel  TSH         Urinalysis, Complete w Microscopic Urine, Clean Catch         POCT Urine Drug Screen - (I-Screen)         POC SARS Coronavirus 2 Ag-ED - Nasal Swab      EKG ordered   Ardis Hughs, NP 09/12/21  1:54 PM

## 2021-09-12 NOTE — ED Notes (Addendum)
Patient was admitted to OBS. Patient is extremely talkative. Patient denies SI/HI and AVH. Patient is refusing to eat at this time. Patient did take her medications.

## 2021-09-12 NOTE — BH Assessment (Addendum)
Comprehensive Clinical Assessment (CCA) Note  09/12/2021 [name] [id number]  DISPOSITION: Effie Shy NP recommends continuous assessment for overnight observation.   Flowsheet Row ED from 09/12/2021 in [facility]  C-SSRS RISK CATEGORY No Risk       The patient demonstrates the following risk factors for suicide: Chronic risk factors for suicide include: N/A. Acute risk factors for suicide include: N/A. Protective factors for this patient include: coping skills. Considering these factors, the overall suicide risk at this point appears to be low. Patient is not appropriate for outpatient follow up.   Patient is a 61 year old female that presents voluntary brought in by [employer] this date for ongoing depression and alcohol abuse. Patient is observed to be actively impaired at the time of assessment and renders limited history. Patient is liable crying followed by laughing and keeps repeating she is a "mountain woman." Patient has limited history per chart review although patient has been seen by [facility] for a seizure disorder in the past and was also diagnosed with depression. Patient reports she resides alone and has recently been depressed over her brother who has a terminal illness. Patient reports that she consumes "a lot of beers" three to four times a week with last use being prior to arrival when she reported she "got her drink on." Patient is vague in reference to use patterns, time frame and amounts consumed. Patient does report she "only drinks beers." Patient denies any other SA use or history of withdrawals. Patient states her seizure disorder is not associated with her alcohol use. Patient denies any S/I, H/I or AVH. Patient denies any prior attempts or gestures at self harm.   Effie Shy NP evaluated patient and writes:   [name], 61 y.o., female patient seen face to face by this provider, consulted with Dr. Lucianne Muss; and chart reviewed on 09/12/21.   Chart review is limited.  However per note from [place] [facility] patient has a past psychiatric history of bipolar 1 disorder and depression.  Patient is a poor historian and denies any psychiatric history.  Of note patient states she has had a seizure in the past.  The seizure was not related to alcohol withdrawal.  Patient states she is not prescribed any medications for seizure disorder.  Reports it has been years since she has had a seizure.  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  She lives alone.  She has 2 children whom she has little contact with.   On evaluation Marvel E Rickel reports today she drank "too many beers".  She was unable to recall the exact amount.  She is unable to state how often she drinks.  She denies any other substance use.  Patient states she feels happy when she should be sad and laughs when she should cry.  Her affect is very labile.  She is very animated. States, " I need to be fixed so I don't laugh all the time". She is visibly intoxicated.  Her speech is loud, pressured, and slurred.  She is disheveled and makes minimal eye contact.  She is alert/oriented x4 and redirectable.  Reports she has been unable to sleep for the past few days and has a decrease in appetite.  She reports being sad due to her brother having stage IV cancer.  She is unable to describe her depressed symptoms and has to be redirected.  She is easily distracted.  She denies SI/HI/AVH.  Objectively she does not appear to  be responding to internal/external stimuli.   Discussed continuous assessment for overnight observation.  Patient is in agreement. Has no collateral to contact at this time.  She will be reevaluated by psychiatry in the a.m.  Chief Complaint: No chief complaint on file.  Visit Diagnosis: Alcohol Abuse     CCA Screening, Triage and Referral (STR)  Patient Reported Information How did you hear about [place]? Self  What Is the Reason for Your Visit/Call Today? Pt presents actively  impaired and depressed  How Long Has This Been Causing You Problems? 1 wk - 1 month  What Do You Feel Would Help You the Most Today? Alcohol or Drug Use Treatment   Have You Recently Had Any Thoughts About Hurting Yourself? No  Are You Planning to Commit Suicide/Harm Yourself At This time? No   Have you Recently Had Thoughts About [place]? No  Are You Planning to Harm Someone at This Time? No  Explanation: No data recorded  Have You Used Any Alcohol or Drugs in the Past 24 Hours? Yes  How Long Ago Did You Use Drugs or Alcohol? No data recorded What Did You Use and How Much? Pt reports using an unknown amount of alcohol   Do You Currently Have a Therapist/Psychiatrist? No  Name of Therapist/Psychiatrist: No data recorded  Have You Been Recently Discharged From Any Office Practice or Programs? No  Explanation of Discharge From Practice/Program: No data recorded    CCA Screening Triage Referral Assessment Type of Contact: Face-to-Face  Telemedicine Service Delivery:   Is this Initial or Reassessment? No data recorded Date Telepsych consult ordered in [facility]:  No data recorded Time Telepsych consult ordered in [facility]:  No data recorded Location of Assessment: [facility]  Provider Location: [facility]   Collateral Involvement: None at this time   Does Patient Have a [place]? No data recorded Name and Contact of Legal Guardian: No data recorded If Minor and Not Living with Parent(s), Who has Custody? NA  Is CPS involved or ever been involved? Never  Is APS involved or ever been involved? Never   Patient Determined To Be At Risk for Harm To Self or Others Based on Review of Patient Reported Information or Presenting Complaint? No  Method: No data recorded Availability of Means: No data recorded Intent: No data recorded Notification Required: No data recorded Additional Information for Danger to  Others Potential: No data recorded Additional Comments for Danger to Others Potential: No data recorded Are There Guns or Other Weapons in Your Home? No data recorded Types of Guns/Weapons: No data recorded Are These Weapons Safely Secured?                            No data recorded Who Could Verify You Are Able To Have These Secured: No data recorded Do You Have any Outstanding Charges, Pending Court Dates, Parole/Probation? No data recorded Contacted To Inform of Risk of Harm To Self or Others: Other: Comment (NA)    Does Patient Present under Involuntary Commitment? No  IVC Papers Initial File Date: No data recorded  [place] of Residence: [place]   Patient Currently Receiving the Following Services: Not Receiving Services   Determination of Need: Urgent (48 hours)   Options For Referral: Facility-Based Crisis     CCA Biopsychosocial Patient Reported Schizophrenia/Schizoaffective Diagnosis in Past: No   Strengths: Pt is willing to participate in treatment  Mental Health Symptoms Depression:   Change in energy/activity; Difficulty Concentrating; Hopelessness   Duration of Depressive symptoms:  Duration of Depressive Symptoms: Greater than two weeks   Mania:   None   Anxiety:    Difficulty concentrating; Irritability   Psychosis:   None   Duration of Psychotic symptoms:    Trauma:   None   Obsessions:   None   Compulsions:   None   Inattention:   None   Hyperactivity/Impulsivity:   None   Oppositional/Defiant Behaviors:   None   Emotional Irregularity:   Chronic feelings of emptiness   Other Mood/Personality Symptoms:   NA    Mental Status Exam Appearance and self-care  Stature:   Small   Weight:   Thin   Clothing:   Disheveled   Grooming:   Neglected   Cosmetic use:   None   Posture/gait:   Slumped   Motor activity:   Restless   Sensorium  Attention:   Distractible   Concentration:   Anxiety interferes    Orientation:  No data recorded  Recall/memory:   Normal   Affect and Mood  Affect:   Anxious; Depressed   Mood:   Depressed; Anxious   Relating  Eye contact:   Normal   Facial expression:   Depressed   Attitude toward examiner:   Cooperative   Thought and Language  Speech flow:  Slurred; Soft   Thought content:   Appropriate to Mood and Circumstances   Preoccupation:   None   Hallucinations:   None   Organization:  No data recorded  [employer] of Knowledge:   Fair   Intelligence:   Average   Abstraction:   Normal   Judgement:   Poor   Reality Testing:   Realistic   Insight:   Fair   Decision Making:   Confused   Social Functioning  Social Maturity:   Responsible   Social Judgement:   Normal   Stress  Stressors:   Transitions   Coping Ability:   Human resources officer Deficits:   Activities of daily living   Supports:   Usual     Religion: Religion/Spirituality Are You A Religious Person?: Yes What is Your Religious Affiliation?: Christian How Might This Affect Treatment?: NA  Leisure/Recreation: Leisure / Recreation Do You Have Hobbies?: No  Exercise/Diet: Exercise/Diet Do You Exercise?: No Have You Gained or Lost A Significant Amount of Weight in the Past Six Months?: No Do You Follow a Special Diet?: No Do You Have Any Trouble Sleeping?: Yes Explanation of Sleeping Difficulties: Pt states she only sleeps 3 to 4 hours a night   CCA Employment/Education Employment/Work Situation: Employment / Work Situation Employment Situation: Unemployed Patient's Job has Been Impacted by Current Illness: No Has Patient ever Been in Equities trader?: No  Education: Education Is Patient Currently Attending School?: No Last Grade Completed: 10 Did You Product manager?: No Did You Have An Individualized Education Program (IIEP): No Did You Have Any Difficulty At [employer]?: No Patient's Education Has Been Impacted by  Current Illness: No   CCA Family/Childhood History Family and Relationship History: Family history Marital status: Single Does patient have children?: Yes How many children?: 2 How is patient's relationship with their children?: Pt states they are adult children and she doesn't get to see them much  Childhood History:  Childhood History By whom was/is the patient raised?: Both parents Did patient suffer any verbal/emotional/physical/sexual abuse as a child?: No Did patient  suffer from severe childhood neglect?: No Has patient ever been sexually abused/assaulted/raped as an adolescent or adult?: No Was the patient ever a victim of a crime or a disaster?: No Witnessed domestic violence?: No Has patient been affected by domestic violence as an adult?: No  Child/Adolescent Assessment:     CCA Substance Use Alcohol/Drug Use: Alcohol / Drug Use Pain Medications: See MAR Prescriptions: See MAR Over the Counter: See MAR History of alcohol / drug use?: Yes Longest period of sobriety (when/how long): Unknown Negative Consequences of Use: Personal relationships Withdrawal Symptoms: None Substance #1 Name of Substance 1: Alcohol 1 - Age of First Use: Pt states she can't remember 1 - Amount (size/oz): Varies 1 - Frequency: Pt states 3 to 4 times a week 1 - Duration: Ongoing 1 - Last Use / Amount: Prior to arrival 1 - Method of Aquiring: NA 1- Route of Use: Oral                       ASAM's:  Six Dimensions of Multidimensional Assessment  Dimension 1:  Acute Intoxication and/or Withdrawal Potential:   Dimension 1:  Description of individual's past and current experiences of substance use and withdrawal: Pt states he feels bad "sometimes" when she doesn't drink  Dimension 2:  Biomedical Conditions and Complications:   Dimension 2:  Description of patient's biomedical conditions and  complications: Pt states she has a "bad back" and hurts all over  Dimension 3:   Emotional, Behavioral, or Cognitive Conditions and Complications:  Dimension 3:  Description of emotional, behavioral, or cognitive conditions and complications: Pt states she "stays depressed" and never gotten any help  Dimension 4:  Readiness to Change:  Dimension 4:  Description of Readiness to Change criteria: Pt states she "does want to cut back"  Dimension 5:  Relapse, Continued use, or Continued Problem Potential:  Dimension 5:  Relapse, continued use, or continued problem potential critiera description: High relapse potential  Dimension 6:  Recovery/Living Environment:  Dimension 6:  Recovery/Iiving environment criteria description: passive social support  ASAM Severity Score: ASAM's Severity Rating Score: 10  ASAM Recommended Level of Treatment:     Substance use Disorder (SUD)    Recommendations for Services/Supports/Treatments:    Discharge Disposition:    DSM5 Diagnoses: Patient Active Problem List   Diagnosis Date Noted   Right wrist fracture 06/04/2019   Fall at home, initial encounter 06/04/2019   Alcohol abuse 06/04/2019   AKI (acute kidney injury) ([place]) 06/04/2019   Tobacco abuse [id number]   Alcoholic ketoacidosis [id number]   Pubic ramus fracture, left, closed, initial encounter ([place]) 08/12/2018   Hyponatremia 08/12/2018   Gait disturbance 08/12/2018   Fibromyalgia 08/12/2018   Acute alcohol intoxication ([place]) 08/12/2018   Anal fissure 03/02/2011   LACERATION, SCALP 08/25/2008   Anxiety state 05/23/2008   EPIGASTRIC PAIN 05/23/2008   DEEP VENOUS THROMBOPHLEBITIS, HX OF 11/14/2006   EMBOLISM/THROMBOSIS, DEEP VSL PRXML LWR EXTRM 08/28/2006     Referrals to Alternative Service(s): Referred to Alternative Service(s):   Place:   Date:   Time:    Referred to Alternative Service(s):   Place:   Date:   Time:    Referred to Alternative Service(s):   Place:   Date:   Time:    Referred to Alternative Service(s):   Place:   Date:   Time:     Mamie Nick,  LCAS

## 2021-09-13 LAB — POCT URINE DRUG SCREEN - MANUAL ENTRY (I-SCREEN)
POC Amphetamine UR: NOT DETECTED
POC Buprenorphine (BUP): NOT DETECTED
POC Cocaine UR: NOT DETECTED
POC Marijuana UR: NOT DETECTED
POC Methadone UR: NOT DETECTED
POC Methamphetamine UR: NOT DETECTED
POC Morphine: NOT DETECTED
POC Oxazepam (BZO): NOT DETECTED
POC Oxycodone UR: NOT DETECTED
POC Secobarbital (BAR): NOT DETECTED

## 2021-09-13 LAB — URINALYSIS, COMPLETE (UACMP) WITH MICROSCOPIC
Bilirubin Urine: NEGATIVE
Glucose, UA: NEGATIVE mg/dL
Hgb urine dipstick: NEGATIVE
Ketones, ur: 5 mg/dL — AB
Leukocytes,Ua: NEGATIVE
Nitrite: NEGATIVE
Protein, ur: NEGATIVE mg/dL
Specific Gravity, Urine: 1.002 — ABNORMAL LOW (ref 1.005–1.030)
pH: 6 (ref 5.0–8.0)

## 2021-09-13 NOTE — ED Notes (Signed)
Remains in flex area with frequent request for various items from food , to drink and assist opening unit door so she could go use the BR. Gait is hesitant but ambulates without assistance and gait appears steady. Has rambling unfocused conversation from the best way to open the unit doors to what snacks that she has had today.

## 2021-09-13 NOTE — Discharge Instructions (Addendum)
Follow up with PCP regarding sodium level 129 and elevated cholesterol.   Please contact one of the following facilities to start medication management and therapy services:   [facility] [address] ([address] [place])  [place], [place]  [address] Phone: [phone]  [facility]  [address] [place] [address], [place] [address] Phone: [phone]  [facility]  [address]  [place], [place] [address]  [facility]  [address]  [place], [place] [address] Phone: [phone]

## 2021-09-13 NOTE — ED Notes (Signed)
Pt resting in bed. Respirations even and unlabored. Monitoring for safety. 

## 2021-09-13 NOTE — ED Provider Notes (Signed)
FBC/OBS ASAP Discharge Summary  Date and Time: 09/13/2021 10:44 PM  Name: [name]  MRN:  [id number]   Discharge Diagnoses:  Final diagnoses:  Acute alcoholic intoxication without complication [facility])    Subjective: [name] 61 y.o., female patient who initially presented to [facility] with alcohol intoxication.  She was admitted to the continuous assessment unit for overnight observation.  [name], 61 y.o., female patient seen face to face by this provider, consulted with Dr. Lucianne Muss; and chart reviewed on 09/13/21.    During evaluation [name] is observed sitting in her bed awake.  She is alert/oriented x4 cooperative and attentive.  Her speech is clear, coherent, normal rate and tone.  She is pleasant and makes good eye contact.  She apologizes and expresses her embarrassment or being intoxicated.  She is requesting to be discharged.  She continues to endorse some depression and anxiety but states she will follow-up with her primary care physician this week.  She denies SI/HI/AVH.  She contracts for safety.  She denies access to firearms/weapons.  She does not appear psychotic or manic.  She is able to converse coherently without any distractibility or preoccupation.  Contacted Dr. Adela Lank at the [facility] ED regarding patient's sodium level of 129.  Patient remains nonsymptomatic.  At this time patient is encouraged to follow-up with PCP within 1 week and have sodium level rechecked.  She is educated and instructed signs and symptoms of hyponatremia and when to seek medical help immediately..  Stay Summary:      [name]'s improvement was monitored by continuous assessment/observation and her report of symptom reduction.  Her emotional and mental status was also monitored by staff.  She remained cooperative while on the unit.  She interacted with staff and other patients appropriately.        [name] was evaluated for stability and plans for continued recovery upon  discharge.  The following was addressed as part of her discharge planning and follow up treatment:  Employment, housing, transportation, bed availability, health status, family support, and any pending legal issues were also considered during her during the continuous assessment/observation.  She was offered further treatment options upon discharge including but not limited to Residential, Intensive Outpatient, Outpatient treatment, Rehabilitation services.  [name] will follow up with the services as listed below under Follow up Information.     Upon completion of this admission the [name] was both mentally and medically stable for discharge denying suicidal/homicidal ideation, auditory/visual/tactile hallucinations, delusional thoughts and paranoia.     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    Past Surgical History:  Procedure Laterality Date   APPENDECTOMY     BREAST SURGERY     benign breast lump removal   CESAREAN SECTION     LAPAROSCOPY     endometriosis   LEEP  2000   ORIF WRIST  FRACTURE Right 06/04/2019   ORIF WRIST FRACTURE Right 06/04/2019   Procedure: OPEN REDUCTION INTERNAL FIXATION (ORIF) WRIST FRACTURE;  Surgeon: Dominica Severin, MD;  Location: [facility] OR;  Service: Orthopedics;  Laterality: Right;   TONSILLECTOMY     TUBAL LIGATION     Family History:  Family History  Problem Relation Age of Onset   Heart disease Mother    Cancer Sister        breast   Heart disease Brother    Coronary artery disease  Other    Stroke Other    Hypertension Other    Family Psychiatric History: see h&p Social History:  Social History   Substance and Sexual Activity  Alcohol Use Yes   Alcohol/week: 28.0 standard drinks of alcohol   Types: 28 Cans of beer per week   Comment: frequently      Social History   Substance and Sexual Activity  Drug Use No    Social History   Socioeconomic History   Marital status: Divorced    Spouse name: Not on file   Number of children: 2   Years of education: 12   Highest education level: Not on file  Occupational History    Comment: home maker  Tobacco Use   Smoking status: Every Day    Packs/day: 1.00    Types: Cigarettes   Smokeless tobacco: Never  Vaping Use   Vaping Use: Never used  Substance and Sexual Activity   Alcohol use: Yes    Alcohol/week: 28.0 standard drinks of alcohol    Types: 28 Cans of beer per week    Comment: frequently    Drug use: No   Sexual activity: Not on file    Comment: unknown  Other Topics Concern   Not on file  Social History Narrative   Not on file   Social Determinants of Health   Financial Resource Strain: Not on file  Food Insecurity: Not on file  Transportation Needs: Not on file  Physical Activity: Not on file  Stress: Not on file  Social Connections: Not on file   SDOH:  SDOH Screenings   Alcohol Screen: Not on file  Depression (OAC1-6): Not on file  Financial Resource Strain: Not on file  Food Insecurity: Not on file  Housing: Not on file  Physical Activity: Not on file  Social Connections: Not on file  Stress: Not on file  Tobacco Use: High Risk (09/12/2021)   Patient History    Smoking Tobacco Use: Every Day    Smokeless Tobacco Use: Never    Passive Exposure: Not on file  Transportation Needs: Not on file    Tobacco Cessation:  N/A, patient does not currently use tobacco products  Current Medications:  No current facility-administered medications for this encounter.   Current  Outpatient Medications  Medication Sig Dispense Refill   albuterol (VENTOLIN HFA) 108 (90 Base) MCG/ACT inhaler Inhale 2 puffs into the lungs every 4 (four) hours as needed for wheezing or shortness of breath. 18 g 5   aspirin 325 MG tablet Take 650 mg by mouth every 6 (six) hours as needed (for pain).      CALCIUM PO Take 1 tablet by mouth 2 (two) times a week.     ibuprofen (ADVIL) 800 MG tablet Take 1 tablet (800 mg total) by mouth every 6 (six) hours as needed for moderate pain (for pain). 120 tablet 5   LORazepam (ATIVAN) 1 MG tablet TAKE 1 TABLET(1 MG) BY MOUTH EVERY 6  HOURS AS NEEDED FOR ANXIETY 120 tablet 5   Multiple Vitamin (MULTIVITAMIN WITH MINERALS) TABS tablet Take 1 tablet by mouth 3 (three) times a week.      tiZANidine (ZANAFLEX) 4 MG tablet Take 1 tablet (4 mg total) by mouth every 6 (six) hours as needed. 120 tablet 5    PTA Medications: (Not in a hospital admission)       No data to display          Flowsheet Row ED from 09/12/2021 in [facility]  C-SSRS RISK CATEGORY No Risk       Musculoskeletal  Strength & Muscle Tone: within normal limits Gait & Station: unsteady Patient leans: N/A  Psychiatric Specialty Exam  Presentation  General Appearance: Disheveled  Eye Contact:Good  Speech:Clear and Coherent; Normal Rate  Speech Volume:Normal  Handedness:Right   Mood and Affect  Mood:Anxious; Depressed  Affect:Congruent   Thought Process  Thought Processes:Coherent  Descriptions of Associations:Intact  Orientation:Full (Time, Place and Person)  Thought Content:Logical  Diagnosis of Schizophrenia or Schizoaffective disorder in past: No    Hallucinations:Hallucinations: None  Ideas of Reference:None  Suicidal Thoughts:Suicidal Thoughts: No  Homicidal Thoughts:Homicidal Thoughts: No   Sensorium  Memory:Remote Good; Immediate Fair; Recent Fair  Judgment:Good  Insight:Good   Executive Functions   Concentration:Good  Attention Span:Good  Recall:Good  Fund of Knowledge:Good  Language:Good   Psychomotor Activity  Psychomotor Activity:Psychomotor Activity: Normal   Assets  Assets:Communication Skills; Desire for Improvement; Financial Resources/Insurance; Leisure Time; Physical Health   Sleep  Sleep:Sleep: Fair Number of Hours of Sleep: 0   Nutritional Assessment (For OBS and FBC admissions only) Has the patient had a weight loss or gain of 10 pounds or more in the last 3 months?: No Has the patient had a decrease in food intake/or appetite?: Yes Does the patient have dental problems?: No Does the patient have eating habits or behaviors that may be indicators of an eating disorder including binging or inducing vomiting?: No Has the patient recently lost weight without trying?: 2.0 Has the patient been eating poorly because of a decreased appetite?: 1 Malnutrition Screening Tool Score: 3    Physical Exam  Physical Exam Vitals and nursing note reviewed.  Constitutional:      General: She is not in acute distress.    Appearance: Normal appearance. She is not ill-appearing.  HENT:     Head: Normocephalic.  Eyes:     General:        Right eye: No discharge.        Left eye: No discharge.     Conjunctiva/sclera: Conjunctivae normal.  Cardiovascular:     Rate and Rhythm: Normal rate.  Pulmonary:     Effort: Pulmonary effort is normal.  Musculoskeletal:        General: Normal range of motion.     Cervical back: Normal range of motion.  Skin:    Coloration: Skin is not jaundiced or pale.  Neurological:     Mental Status: She is alert and oriented to person, place, and time.  Psychiatric:        Attention and Perception: Attention and perception normal.        Mood and Affect: Affect normal. Mood is anxious and depressed.        Speech: Speech normal.        Behavior: Behavior is cooperative.        Thought Content: Thought content normal.        Cognition  and Memory: Cognition normal.        Judgment: Judgment normal.    Review of Systems  Constitutional: Negative.   HENT: Negative.    Eyes: Negative.   Respiratory: Negative.    Cardiovascular: Negative.   Musculoskeletal: Negative.   Skin: Negative.   Neurological: Negative.   Endo/Heme/Allergies: Negative.   Psychiatric/Behavioral:  Positive for depression and substance abuse. The patient is nervous/anxious.    Blood pressure 131/79, pulse 66, temperature 98.2 F (36.8 C), temperature source Oral, resp. rate 16, SpO2 98 %. There is no height or weight on file to calculate BMI.  Demographic Factors:  Caucasian, Low socioeconomic status, Living alone, and Unemployed  Loss Factors: Financial problems/change in socioeconomic status  Historical Factors: Impulsivity  Risk Reduction Factors:   Sense of responsibility to family, Positive social support, Positive therapeutic relationship, and Positive coping skills or problem solving skills  Continued Clinical Symptoms:  Severe Anxiety and/or Agitation Depression:   Comorbid alcohol abuse/dependence Impulsivity Alcohol/Substance Abuse/Dependencies  Cognitive Features That Contribute To Risk:  None    Suicide Risk:  Minimal: No identifiable suicidal ideation.  Patients presenting with no risk factors but with morbid ruminations; may be classified as minimal risk based on the severity of the depressive symptoms  Plan Of Care/Follow-up recommendations:  Activity:  as tolerated  Diet:  regular   Disposition:   Discharge patient   Provided outpatient psychiatric resources for medication management and therapy.  Instructed patient to follow-up with PCP regarding elevated cholesterol levels and decreased sodium level.  Educated patient on signs and symptoms of low sodium and instructed medical help immediately.  Patient declined all resources for substance abuse treatment. No evidence of imminent risk to self or others at  present.    Patient does not meet criteria for psychiatric inpatient admission. Discussed crisis plan, support from social network, calling 911, coming to the Emergency Department, and calling Suicide Hotline.   Ardis Hughs, NP 09/13/2021, 10:44 PM

## 2021-09-13 NOTE — ED Notes (Signed)
Pt discharged in no acute distress. Denied current SI/HI/AVH. A&O x4, ambulatory. Verbalized understanding of AVS instructions reviewed by RN. Belongings returned to pt intact from locker #28. Pt escorted to front lobby by staff. Taxi called. Safety maintained.

## 2021-09-13 NOTE — ED Notes (Signed)
Mrs Wanninger  remains asleep no distressed or disturbed sleeping patterns noted respirations are easy pt comfortable.

## 2021-09-13 NOTE — ED Notes (Signed)
Currently sleeping no distressed or disturbed sleep noted respirations WNL and skin color is WNL for ethnicitiy.

## 2021-09-13 NOTE — ED Notes (Signed)
[facility] called for pick up for UA

## 2021-09-21 ENCOUNTER — Telehealth (HOSPITAL_COMMUNITY): Payer: Self-pay | Admitting: Family Medicine

## 2021-09-21 NOTE — BH Assessment (Signed)
Care Management - [facility]UC Follow Up Discharges  ° °Writer attempted to make contact with patient today and was unsuccessful.  Writer left a HIPPA compliant voice message.  ° °Per chart review, patient was provided with outpatient resources. ° °

## 2021-11-15 ENCOUNTER — Telehealth: Payer: Self-pay | Admitting: Family Medicine

## 2021-11-15 NOTE — Telephone Encounter (Signed)
Last refill-03/13/20** Last VV-02/04/21  Okay for refill?

## 2021-11-15 NOTE — Telephone Encounter (Signed)
Pharmacist call and stated the pt need a refill on her albuterol (VENTOLIN HFA) 108 (90 Base) MCG/ACT inhaler sent he also stated pt said if you need to talk to her you can call her .

## 2021-11-16 ENCOUNTER — Other Ambulatory Visit: Payer: Self-pay

## 2021-11-16 MED ORDER — ALBUTEROL SULFATE HFA 108 (90 BASE) MCG/ACT IN AERS
2.0000 | INHALATION_SPRAY | RESPIRATORY_TRACT | 11 refills | Status: AC | PRN
Start: 2021-11-16 — End: ?

## 2021-11-16 NOTE — Telephone Encounter (Signed)
Refill for Albuterol inhaler sent to [facility] on [address].

## 2021-11-16 NOTE — Telephone Encounter (Signed)
Call in #1 inhaler with 11 rf  

## 2022-02-04 IMAGING — CR DG HAND 2V*R*
2 series · 2 of 2 positions shown · non-contrast
Comparison: None.

CLINICAL DATA: Fall

EXAM:
RIGHT HAND - 2 VIEW

[hand pa]
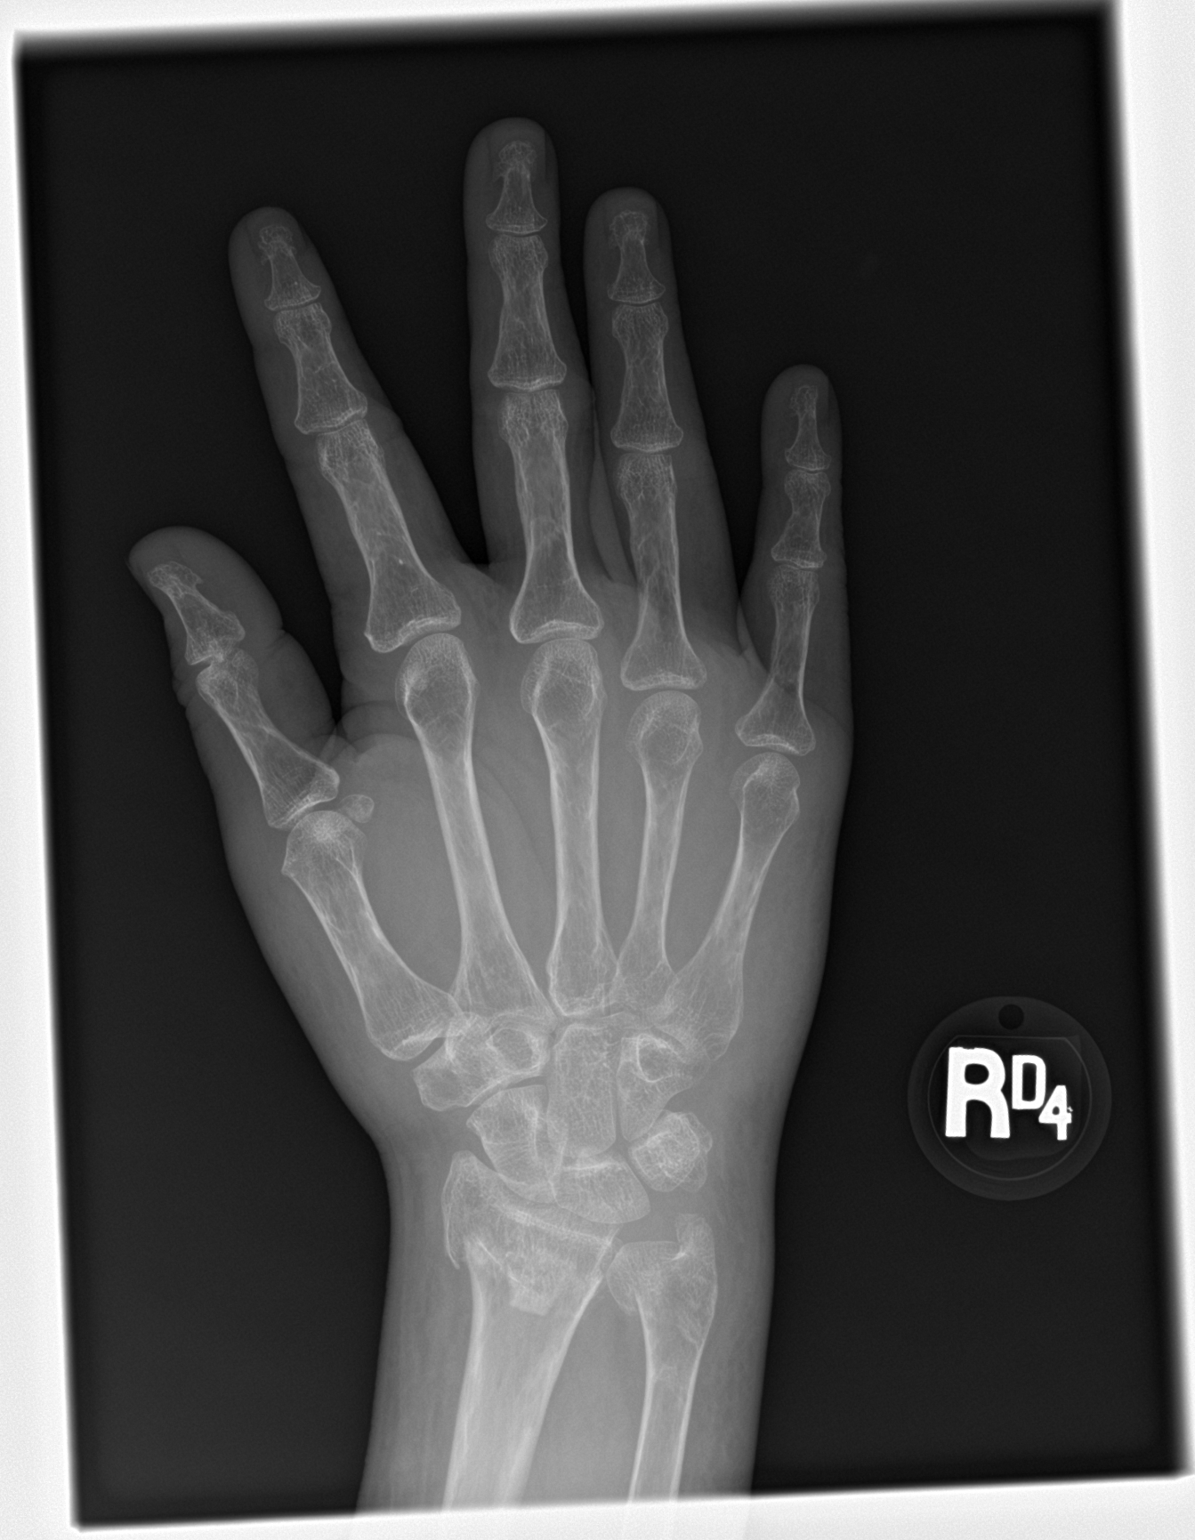

[hand lat]
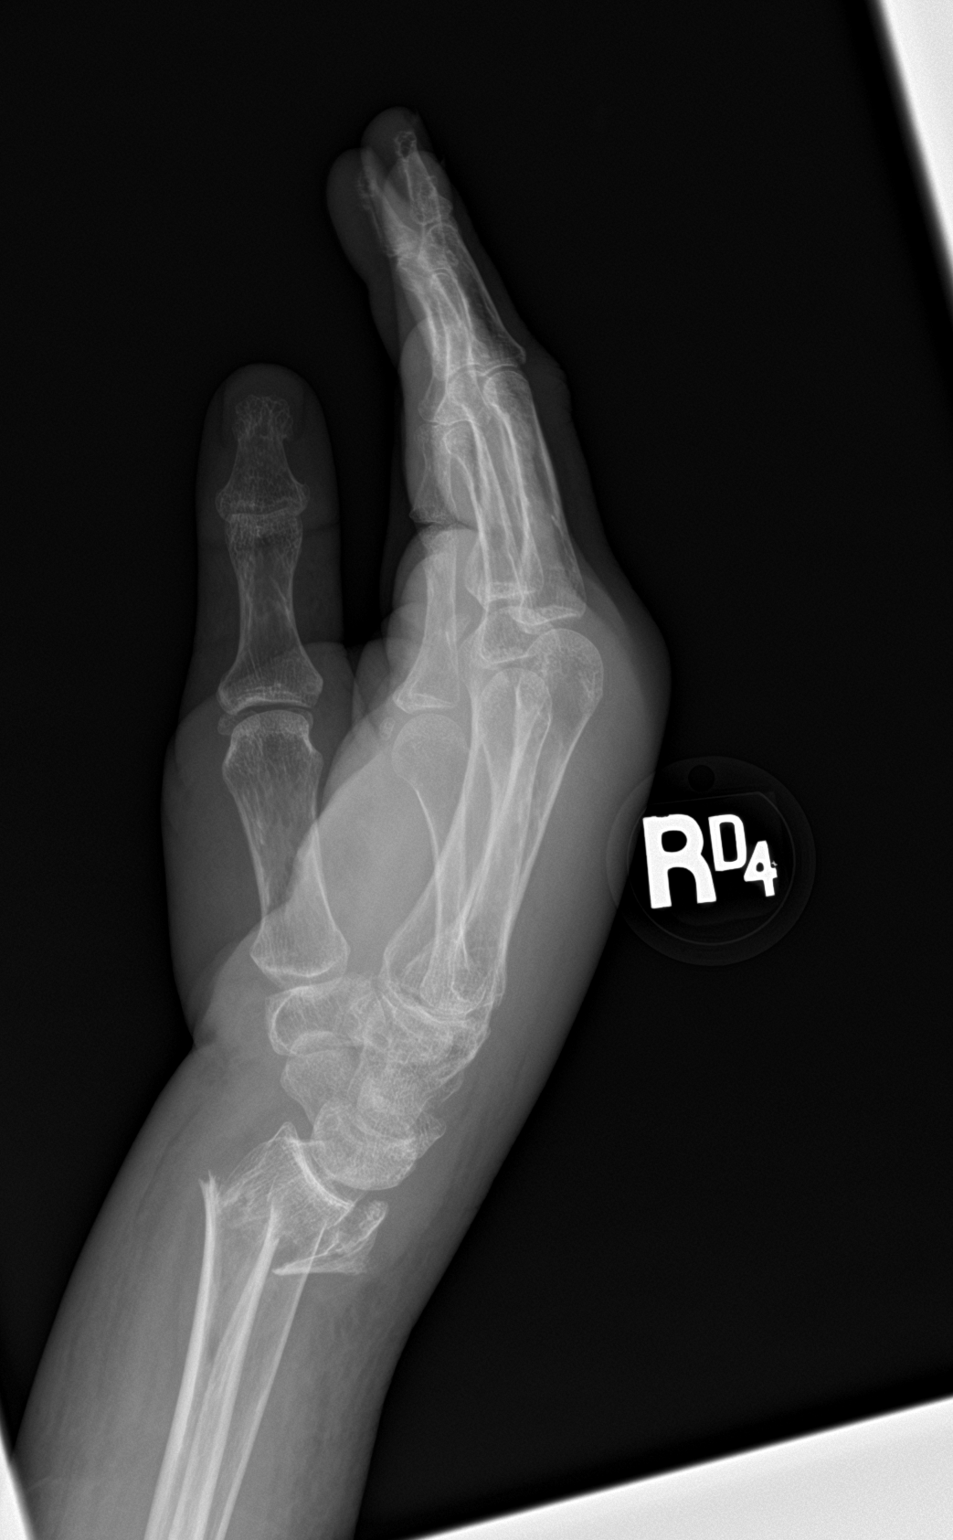

[2 of 2 positions shown; findings below may reference images not displayed]

FINDINGS: Acute fracture of the distal right radius with dorsal displacement
and angulation. No definite intra-articular extension. Additional
acute fracture of the distal ulna with significant dorsal
displacement. Soft tissue swelling is present.
IMPRESSION: Acute fracture of the distal right radius with dorsal displacement
and angulation.

Acute fracture of the distal ulna with significant dorsal
displacement.

## 2022-05-05 ENCOUNTER — Telehealth: Payer: Self-pay | Admitting: Family Medicine

## 2022-05-05 NOTE — Telephone Encounter (Signed)
Spoke with pt advised to call back and schedule appointment per Dr Sarajane Jews, pt state that she will when she find transportation

## 2022-05-05 NOTE — Telephone Encounter (Signed)
Prescription Request  05/05/2022  LOV:  02/04/21  (VV)  What is the name of the medication or equipment?  tiZANidine (ZANAFLEX) 4 MG tablet    LORazepam (ATIVAN) 1 MG tablet  Pt states she is almost out of each Rx.    Have you contacted your pharmacy to request a refill? No   Which pharmacy would you like this sent to?   [facility] [place], [place] [address] [place] Phone: [phone]  Fax: [phone]      Patient notified that their request is being sent to the clinical staff for review and that they should receive a response within 2 business days.   Please advise at Mobile [phone] (mobile)

## 2022-05-05 NOTE — Telephone Encounter (Signed)
She would need an in person OV for any refills

## 2022-05-05 NOTE — Telephone Encounter (Signed)
Last refill Lorazepam-08/12/21 Last refill Tizandine-02/04/22  No future OV scheduled

## 2022-07-19 ENCOUNTER — Ambulatory Visit (INDEPENDENT_AMBULATORY_CARE_PROVIDER_SITE_OTHER): Payer: Self-pay | Admitting: Family Medicine

## 2022-07-19 ENCOUNTER — Encounter: Payer: Self-pay | Admitting: Family Medicine

## 2022-07-19 VITALS — BP 124/78 | HR 78 | Temp 98.0°F | Ht 61.0 in | Wt 114.0 lb

## 2022-07-19 DIAGNOSIS — H00011 Hordeolum externum right upper eyelid: Secondary | ICD-10-CM

## 2022-07-19 DIAGNOSIS — D3613 Benign neoplasm of peripheral nerves and autonomic nervous system of lower limb, including hip: Secondary | ICD-10-CM

## 2022-07-19 DIAGNOSIS — L700 Acne vulgaris: Secondary | ICD-10-CM

## 2022-07-19 MED ORDER — TIZANIDINE HCL 4 MG PO TABS: 4.0000 mg | ORAL_TABLET | Freq: Four times a day (QID) | ORAL | 5 refills | Status: DC | PRN

## 2022-07-19 MED ORDER — LORAZEPAM 1 MG PO TABS: 1.0000 mg | ORAL_TABLET | Freq: Four times a day (QID) | ORAL | 5 refills | Status: DC | PRN

## 2022-07-19 MED ORDER — ERYTHROMYCIN 5 MG/GM OP OINT: 1.0000 | TOPICAL_OINTMENT | Freq: Four times a day (QID) | OPHTHALMIC | 1 refills | Status: AC

## 2022-07-19 NOTE — Progress Notes (Signed)
   Subjective:    Patient ID: [name], female    DOB: 1960/06/13, 62 y.o.   MRN: [id number]  HPI Here for 3 issues. First she has had a tender knot in the left groin for 10 years or so, but she says over the past few months it has gotten harder and it now has "black spots" on it. Second one year ago she developed a painful knot on the arch of her right foot. It is now difficult to walk on. Third one week ago she developed a tender bump on the right upper eyelid.    Review of Systems  Constitutional: Negative.   Respiratory: Negative.    Cardiovascular: Negative.        Objective:   Physical Exam Constitutional:      Appearance: Normal appearance.  Eyes:     Conjunctiva/sclera: Conjunctivae normal.     Pupils: Pupils are equal, round, and reactive to light.     Comments: There is a small tender nodule on the edge of the right upper eyelid.   Cardiovascular:     Rate and Rhythm: Normal rate and regular rhythm.     Pulses: Normal pulses.     Heart sounds: Normal heart sounds.  Pulmonary:     Effort: Pulmonary effort is normal.     Breath sounds: Normal breath sounds.  Musculoskeletal:     Comments: There is a tender round nodule about 0.5 cm in diameter on the arch of the right foot   Skin:    Comments: The lesion in her left groin is a large comedone (blackhead)   Neurological:     Mental Status: She is alert.           Assessment & Plan:  She has a stye on the right upper eyelid, and we will treat this with warm compresses and Erythromycin ophthalmic ointment. For the neuroma on the right foot, we will refer her to Podiatry. For the comedone in the left groin, I explained that this is benign and no treatment is required.  Gershon Crane, MD

## 2022-07-28 ENCOUNTER — Encounter: Payer: Self-pay | Admitting: Podiatry

## 2022-07-28 ENCOUNTER — Ambulatory Visit (INDEPENDENT_AMBULATORY_CARE_PROVIDER_SITE_OTHER): Payer: Self-pay | Admitting: Podiatry

## 2022-07-28 ENCOUNTER — Ambulatory Visit: Payer: Self-pay

## 2022-07-28 DIAGNOSIS — M19071 Primary osteoarthritis, right ankle and foot: Secondary | ICD-10-CM

## 2022-07-28 DIAGNOSIS — L989 Disorder of the skin and subcutaneous tissue, unspecified: Secondary | ICD-10-CM | POA: Insufficient documentation

## 2022-07-28 MED ORDER — TRIAMCINOLONE ACETONIDE 40 MG/ML IJ SUSP
20.0000 mg | Freq: Once | INTRAMUSCULAR | Status: AC
Start: 2022-07-28 — End: 2022-07-28
  Administered 2022-07-28: 20 mg

## 2022-07-28 NOTE — Progress Notes (Signed)
Subjective:  Patient ID: [name], female    DOB: [birth year],  MRN: [id number] HPI Chief Complaint  Patient presents with   Foot Pain    Arch right - knot x several months, tender at times when walking, wants to know what can be done, also said she had an injury where a large trash can fell on foot years ago dorsal forefoot, "doctor said it healed wrong and that's why it hurts"   New Patient (Initial Visit)    62 y.o. female presents with the above complaint.   ROS: Denies fever chills nausea vomit muscle aches pains calf pain back pain chest pain shortness of breath.  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   Past Surgical History:  Procedure Laterality Date   APPENDECTOMY     BREAST SURGERY     benign breast lump removal   CESAREAN SECTION     LAPAROSCOPY     endometriosis   LEEP  2000   ORIF WRIST FRACTURE Right 06/04/2019   ORIF WRIST FRACTURE Right 06/04/2019   Procedure: OPEN REDUCTION INTERNAL FIXATION (ORIF) WRIST FRACTURE;  Surgeon: Dominica Severin, MD;  Location: [facility] OR;  Service: Orthopedics;  Laterality: Right;   TONSILLECTOMY     TUBAL LIGATION      Current Outpatient Medications:    albuterol (VENTOLIN HFA) 108 (90 Base) MCG/ACT inhaler, Inhale 2 puffs into the lungs every 4 (four) hours as needed for wheezing or shortness of breath., Disp: 1 each, Rfl: 11   aspirin 325 MG tablet, Take 650 mg by mouth every 6 (six) hours as needed (for pain). , Disp: , Rfl:    CALCIUM PO, Take 1 tablet by mouth 2 (two) times a week., Disp: , Rfl:    erythromycin ophthalmic ointment, Place 1 Application into the right  eye 4 (four) times daily., Disp: 3.5 g, Rfl: 1   ibuprofen (ADVIL) 800 MG tablet, Take 1 tablet (800 mg total) by mouth every 6 (six) hours as needed for moderate pain (for pain)., Disp: 120 tablet, Rfl: 5   LORazepam (ATIVAN) 1 MG tablet, Take 1 tablet (1 mg total) by mouth every 6 (six) hours as needed for anxiety., Disp: 120 tablet, Rfl: 5   Multiple Vitamin (MULTIVITAMIN WITH MINERALS) TABS tablet, Take 1 tablet by mouth 3 (three) times a week. , Disp: , Rfl:    tiZANidine (ZANAFLEX) 4 MG tablet, Take 1 tablet (4 mg total) by mouth every 6 (six) hours as needed., Disp: 120 tablet, Rfl: 5  Allergies  Allergen Reactions   Acetaminophen Other (See Comments)    Patient stated, " I overdosed on it."   Compazine Swelling and Other (See Comments)    Convulsions/shaking and couldn't swallow anything   Review of Systems Objective:  There were no vitals filed for this visit.  General: Well developed, nourished, in no acute distress, alert and oriented x3   Dermatological: Skin is warm, dry and supple bilateral. Nails x 10 are well maintained; remaining integument appears unremarkable at this time. There  are no open sores, no preulcerative lesions, no rash or signs of infection present.  Vascular: Dorsalis Pedis artery and Posterior Tibial artery pedal pulses are 2/4 bilateral with immedate capillary fill time. Pedal hair growth present. No varicosities and no lower extremity edema present bilateral.   Neruologic: Grossly intact via light touch bilateral. Vibratory intact via tuning fork bilateral. Protective threshold with Semmes Wienstein monofilament intact to all pedal sites bilateral. Patellar and Achilles deep tendon reflexes 2+ bilateral. No Babinski or clonus noted bilateral.   Musculoskeletal: No gross boney pedal deformities bilateral. No pain, crepitus, or limitation noted with foot and ankle range of motion bilateral. Muscular strength 5/5 in all groups tested bilateral.  2.0 cm in  length nonpulsatile lesion to the proximal medial border of the plantar fascia right foot.  Tender on palpation.  It only measures 0.6 cc she also has pain on palpation and range of motion of the second metatarsal phalangeal joint and second metatarsal space.   .  Gait: Unassisted, Nonantalgic.    Radiographs: None taken  Assessment & Plan:   Assessment: Plantar fibromatosis.  Capsulitis second metatarsophalangeal joint right foot.  Plan: Injected 10 mg Kenalog around the second metatarsophalangeal joint.  After that she did not want an injection into the plantar fibroma stating that was on her so much.     Cherina Dhillon T. [place], [place]

## 2022-11-11 ENCOUNTER — Other Ambulatory Visit: Payer: Self-pay | Admitting: Family Medicine

## 2022-11-11 DIAGNOSIS — Z1211 Encounter for screening for malignant neoplasm of colon: Secondary | ICD-10-CM

## 2022-11-11 DIAGNOSIS — Z1212 Encounter for screening for malignant neoplasm of rectum: Secondary | ICD-10-CM

## 2023-02-02 ENCOUNTER — Other Ambulatory Visit: Payer: Self-pay | Admitting: Family Medicine

## 2023-02-03 NOTE — Telephone Encounter (Signed)
Pt LOV was on 07/28/22 Please advise

## 2023-07-30 ENCOUNTER — Other Ambulatory Visit: Payer: Self-pay | Admitting: Family Medicine

## 2023-07-31 ENCOUNTER — Other Ambulatory Visit: Payer: Self-pay | Admitting: Family Medicine

## 2023-07-31 MED ORDER — TIZANIDINE HCL 4 MG PO TABS: 4.0000 mg | ORAL_TABLET | Freq: Four times a day (QID) | ORAL | 5 refills | Status: AC | PRN

## 2023-07-31 NOTE — Telephone Encounter (Signed)
 Copied from CRM [id number]. Topic: Clinical - Medication Refill >> Jul 31, 2023  9:16 AM Dawna HERO wrote: Medication: tiZANidine  (ZANAFLEX ) 4 MG tablet  Has the patient contacted their pharmacy? Yes, pharmacy called in for patient (Agent: If no, request that the patient contact the pharmacy for the refill. If patient does not wish to contact the pharmacy document the reason why and proceed with request.) (Agent: If yes, when and what did the pharmacy advise?)  This is the patient's preferred pharmacy:  [id number] [place], [place] - [address] AT [facility] [address] [facility] [address] [place] [address] Phone: [phone] Fax: [phone]  Is this the correct pharmacy for this prescription? Yes If no, delete pharmacy and type the correct one.   Has the prescription been filled recently? No  Is the patient out of the medication? Yes  Has the patient been seen for an appointment in the last year OR does the patient have an upcoming appointment? Yes  Can we respond through MyChart? Yes  Agent: Please be advised that Rx refills may take up to 3 business days. We ask that you follow-up with your pharmacy.

## 2023-09-08 ENCOUNTER — Other Ambulatory Visit: Payer: Self-pay | Admitting: Family Medicine
